# Patient Record
Sex: Male | Born: 1970
Health system: Southern US, Community
[De-identification: ages and names within clinical notes are randomized; demographics above are authoritative.]

## PROBLEM LIST (undated history)

## (undated) DIAGNOSIS — Z87438 Personal history of other diseases of male genital organs: Secondary | ICD-10-CM

## (undated) DIAGNOSIS — Z800 Family history of malignant neoplasm of digestive organs: Secondary | ICD-10-CM

## (undated) DIAGNOSIS — R3129 Other microscopic hematuria: Secondary | ICD-10-CM

## (undated) DIAGNOSIS — R918 Other nonspecific abnormal finding of lung field: Secondary | ICD-10-CM

## (undated) DIAGNOSIS — J324 Chronic pansinusitis: Secondary | ICD-10-CM

## (undated) DIAGNOSIS — L649 Androgenic alopecia, unspecified: Secondary | ICD-10-CM

## (undated) DIAGNOSIS — K602 Anal fissure, unspecified: Secondary | ICD-10-CM

## (undated) DIAGNOSIS — E781 Pure hyperglyceridemia: Secondary | ICD-10-CM

## (undated) DIAGNOSIS — K219 Gastro-esophageal reflux disease without esophagitis: Secondary | ICD-10-CM

## (undated) DIAGNOSIS — J309 Allergic rhinitis, unspecified: Secondary | ICD-10-CM

## (undated) DIAGNOSIS — Z8051 Family history of malignant neoplasm of kidney: Secondary | ICD-10-CM

## (undated) HISTORY — DX: Personal history of other diseases of male genital organs: Z87.438

## (undated) HISTORY — PX: CARDIOVASCULAR STRESS TEST: SHX262

## (undated) HISTORY — DX: Anal fissure, unspecified: K60.2

## (undated) HISTORY — DX: Androgenic alopecia, unspecified: L64.9

## (undated) HISTORY — DX: Chronic pansinusitis: J32.4

## (undated) HISTORY — DX: Family history of malignant neoplasm of digestive organs: Z80.0

## (undated) HISTORY — DX: Pure hyperglyceridemia: E78.1

## (undated) HISTORY — PX: OTHER SURGICAL HISTORY: SHX169

## (undated) HISTORY — DX: Allergic rhinitis, unspecified: J30.9

## (undated) HISTORY — DX: Gastro-esophageal reflux disease without esophagitis: K21.9

## (undated) HISTORY — DX: Other microscopic hematuria: R31.29

## (undated) HISTORY — DX: Other nonspecific abnormal finding of lung field: R91.8

## (undated) HISTORY — DX: Family history of malignant neoplasm of kidney: Z80.51

---

## 2007-11-04 DIAGNOSIS — K602 Anal fissure, unspecified: Secondary | ICD-10-CM

## 2007-11-04 HISTORY — DX: Anal fissure, unspecified: K60.2

## 2012-08-13 DIAGNOSIS — L659 Nonscarring hair loss, unspecified: Secondary | ICD-10-CM | POA: Insufficient documentation

## 2013-12-19 ENCOUNTER — Ambulatory Visit: Payer: Self-pay | Admitting: Family Medicine

## 2014-02-08 ENCOUNTER — Ambulatory Visit (INDEPENDENT_AMBULATORY_CARE_PROVIDER_SITE_OTHER): Payer: 59 | Admitting: Family Medicine

## 2014-02-08 ENCOUNTER — Encounter: Payer: Self-pay | Admitting: Family Medicine

## 2014-02-08 VITALS — BP 120/72 | HR 75 | Temp 97.9°F | Ht 69.5 in | Wt 192.2 lb

## 2014-02-08 DIAGNOSIS — Z0000 Encounter for general adult medical examination without abnormal findings: Secondary | ICD-10-CM | POA: Insufficient documentation

## 2014-02-08 LAB — LIPID PANEL
CHOL/HDL RATIO: 4
Cholesterol: 150 mg/dL (ref 0–200)
HDL: 35.3 mg/dL — ABNORMAL LOW (ref >39.00)
LDL CALC: 89 mg/dL (ref 0–99)
Triglycerides: 131 mg/dL (ref 0.0–149.0)
VLDL: 26.2 mg/dL (ref 0.0–40.0)

## 2014-02-08 LAB — BASIC METABOLIC PANEL
BUN: 14 mg/dL (ref 6–23)
CHLORIDE: 106 meq/L (ref 96–112)
CO2: 25 mEq/L (ref 19–32)
Calcium: 9.6 mg/dL (ref 8.4–10.5)
Creatinine, Ser: 1.1 mg/dL (ref 0.4–1.5)
GFR: 78.87 mL/min (ref >60.00)
Glucose, Bld: 91 mg/dL (ref 70–99)
POTASSIUM: 4.1 meq/L (ref 3.5–5.1)
SODIUM: 139 meq/L (ref 135–145)

## 2014-02-08 NOTE — Progress Notes (Signed)
BP 120/72  Pulse 75  Temp(Src) 97.9 F (36.6 C) (Oral)  Ht 5' 9.5" (1.765 m)  Wt 192 lb 4 oz (87.204 kg)  BMI 27.99 kg/m2  SpO2 98%   CC: new pt to establish, would like CPE   Subjective:    Patient ID: Raymond Nichols, male    DOB: 02/04/1971, 43 y.o.   MRN: [id number]  HPI: Raymond Nichols is a 43 y.o. male presenting on 02/08/2014 for Establish Care   Prior PCP was MD at [facility], seen once for CPE WNL blood work. On propecia for androgenic alopecia.  Preventative: Fasting today. Nocturia x1-2 nightly, newer.  Strong stream. Flu shot not done Tetanus - ~2008 Seat belt use discussed Sunscreen use dicussed.  To see dermatologist in May.  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  Allergies and medications reviewed and updated. No current outpatient prescriptions on file prior to visit.   No current facility-administered medications on file prior to visit.    Review of Systems  Constitutional: Negative for fever, chills, activity change, appetite change, fatigue and unexpected weight change.  HENT: Negative for hearing loss.   Eyes: Negative for visual disturbance.  Respiratory: Negative for cough, chest tightness, shortness of breath and wheezing.   Cardiovascular: Negative for chest pain, palpitations and leg swelling.  Gastrointestinal: Negative for nausea, vomiting, abdominal pain, diarrhea, constipation, blood in stool and abdominal distention.  Genitourinary: Negative for hematuria and difficulty urinating.  Musculoskeletal: Negative for arthralgias, myalgias and neck pain.  Skin: Negative for rash.  Neurological: Negative for dizziness, seizures, syncope and headaches.  Hematological: Negative for adenopathy. Does not bruise/bleed  easily.  Psychiatric/Behavioral: Negative for dysphoric mood. The patient is not nervous/anxious.    Per HPI unless specifically indicated above    Objective:    BP 120/72  Pulse 75  Temp(Src) 97.9 F (36.6 C) (Oral)  Ht 5' 9.5" (1.765 m)  Wt 192 lb 4 oz (87.204 kg)  BMI 27.99 kg/m2  SpO2 98%  Physical Exam  Nursing note and vitals reviewed. Constitutional: He is oriented to person, place, and time. He appears well-developed and well-nourished. No distress.  HENT:  Head: Normocephalic and atraumatic.  Right Ear: Hearing, tympanic membrane, external ear and ear canal normal.  Left Ear: Hearing, tympanic membrane, external ear and ear canal normal.  Nose: Nose normal.  Mouth/Throat: Uvula is midline, oropharynx is clear and moist and mucous membranes are normal. No oropharyngeal exudate, posterior oropharyngeal edema or posterior oropharyngeal erythema.  Eyes: Conjunctivae and EOM are normal. Pupils are equal, round, and reactive to light. No scleral icterus.  Neck: Normal range of motion. Neck supple. No thyromegaly present.  Cardiovascular: Normal rate, regular rhythm, normal heart sounds and intact distal pulses.   No murmur heard. Pulses:      Radial pulses are 2+ on the right side, and 2+ on the left side.  Pulmonary/Chest: Effort normal and breath sounds normal. No respiratory distress. He has no wheezes. He has no rales.  Abdominal: Soft. Bowel sounds are normal. He exhibits no distension and no mass. There is no tenderness. There is no rebound and no guarding.  Musculoskeletal: Normal range of motion. He exhibits no edema.  Lymphadenopathy:    He has no cervical adenopathy.  Neurological: He is alert and oriented  to person, place, and time.  CN grossly intact, station and gait intact  Skin: Skin is warm and dry. No rash noted.  Psychiatric: He has a normal mood and affect. His behavior is normal. Judgment and thought content normal.   No results found for this or any  previous visit.    Assessment & Plan:  Reviewed possible association with propecia and more aggressive prostate cancers.  Problem List Items Addressed This Visit   Health maintenance examination - Primary     Preventative protocols reviewed and updated unless pt declined. Discussed healthy diet and lifestyle. Check fasting labwork today.    Relevant Orders      Lipid panel      Basic metabolic panel       Follow up plan: Return in about 1 year (around 02/09/2015), or as needed, for physical.

## 2014-02-08 NOTE — Patient Instructions (Signed)
Good to meet you today, call us with questions.  Return as needed or in 1 -2 years for next physical.

## 2014-02-08 NOTE — Assessment & Plan Note (Signed)
Preventative protocols reviewed and updated unless pt declined. Discussed healthy diet and lifestyle. Check fasting labwork today.

## 2014-02-08 NOTE — Progress Notes (Signed)
Pre visit review using our clinic review tool, if applicable. No additional management support is needed unless otherwise documented below in the visit note. 

## 2014-02-10 ENCOUNTER — Encounter: Payer: Self-pay | Admitting: *Deleted

## 2015-02-21 ENCOUNTER — Ambulatory Visit (INDEPENDENT_AMBULATORY_CARE_PROVIDER_SITE_OTHER): Payer: 59 | Admitting: Family Medicine

## 2015-02-21 ENCOUNTER — Encounter: Payer: Self-pay | Admitting: Family Medicine

## 2015-02-21 VITALS — BP 114/62 | HR 98 | Temp 98.5°F | Wt 195.2 lb

## 2015-02-21 DIAGNOSIS — J069 Acute upper respiratory infection, unspecified: Secondary | ICD-10-CM

## 2015-02-21 MED ORDER — AMOXICILLIN-POT CLAVULANATE 875-125 MG PO TABS: 1.0000 | ORAL_TABLET | Freq: Two times a day (BID) | ORAL | Status: AC

## 2015-02-21 MED ORDER — FLUTICASONE PROPIONATE 50 MCG/ACT NA SUSP: 2.0000 | Freq: Every day | NASAL | Status: DC

## 2015-02-21 NOTE — Progress Notes (Signed)
Pre visit review using our clinic review tool, if applicable. No additional management support is needed unless otherwise documented below in the visit note. 

## 2015-02-21 NOTE — Progress Notes (Signed)
SUBJECTIVE:  Raymond Nichols is a 44 y.o. male pt of Dr. Darnell Level, new to me, who complains of congestion, sore throat and bilateral sinus pain for 1 day. He denies a history of anorexia, chest pain, dizziness, fatigue, fevers, shortness of breath, sweats and vomiting and denies a history of asthma. Patient denies smoke cigarettes.  Flying out to [place] later today--"I don't want to miserable on my trip."  Taking OTC antihistamine/cold medicine- cannot remember which one.  Not much improvement with rx.  No h/o seasonal allergies.   Current Outpatient Prescriptions on File Prior to Visit  Medication Sig Dispense Refill  . finasteride (PROPECIA) 1 MG tablet Take 1 mg by mouth daily.     No current facility-administered medications on file prior to visit.    No Known Allergies  Past Medical History  Diagnosis Date  . Androgenic alopecia     No past surgical history on file.  Family History  Problem Relation Age of Onset  . Cancer Mother 37    lung (smoker)  . CAD Mother     stent  . Hypertension Mother   . Diabetes Mother   . Cancer Father 70    colon  . Hyperlipidemia Father   . Hypertension Brother   . Diabetes Maternal Grandmother   . Alcohol abuse Maternal Grandfather   . Cancer Paternal Grandfather     pancreatic    History   Social History  . Marital Status: Married    Spouse Name: N/A  . Number of Children: N/A  . Years of Education: N/A   Occupational History  . Not on file.   Social History Main Topics  . Smoking status: Never Smoker   . Smokeless tobacco: Never Used  . Alcohol Use: Yes     Comment: 2 beers once a week  . Drug Use: No  . Sexual Activity: Not on file   Other Topics Concern  . Not on file   Social History Narrative   Caffeine: 1-2 cups coffee   Lives with wife and dad, no pets   Occupation: Freight forwarder for [employer]   Edu: college   Activity: gym 2-3 times a week, weights and cardio   Diet: good water, fruits/vegetables daily    The PMH, PSH, Social History, Family History, Medications, and allergies have been reviewed in [facility], and have been updated if relevant.  OBJECTIVE: BP 114/62 mmHg  Pulse 98  Temp(Src) 98.5 F (36.9 C) (Oral)  Wt 195 lb 4 oz (88.565 kg)  SpO2 98%  He appears well, vital signs are as noted. Ears normal.  Throat and pharynx normal.  Neck supple. No adenopathy in the neck. Nose is congested. Sinuses non tender. The chest is clear, without wheezes or rales.  ASSESSMENT:  viral upper respiratory illness  PLAN: Symptomatic therapy suggested: push fluids, rest and return office visit prn if symptoms persist or worsen. Lack of antibiotic effectiveness discussed with him. eRx sent for flonase, Augmentin rx to fill only if symptoms do not improve or progress over next 5-7 days. Call or return to clinic prn if these symptoms worsen or fail to improve as anticipated. The patient indicates understanding of these issues and agrees with the plan.

## 2015-02-21 NOTE — Patient Instructions (Signed)
Nice to meet you. Have a wonderful trip to [place]!  Start flonase, continue antihistamine. Fill your antibiotic prescription, Augmentin if your symptoms do not improve over next 5-7 days as expected.

## 2015-10-01 ENCOUNTER — Telehealth: Payer: Self-pay | Admitting: *Deleted

## 2015-10-01 ENCOUNTER — Emergency Department (HOSPITAL_COMMUNITY): Payer: 59

## 2015-10-01 ENCOUNTER — Emergency Department (HOSPITAL_COMMUNITY)
Admission: EM | Admit: 2015-10-01 | Discharge: 2015-10-01 | Disposition: A | Discharge status: Home / Self Care | Payer: 59 | Attending: Emergency Medicine | Admitting: Emergency Medicine

## 2015-10-01 ENCOUNTER — Telehealth: Payer: Self-pay | Admitting: Family Medicine

## 2015-10-01 ENCOUNTER — Encounter (HOSPITAL_COMMUNITY): Payer: Self-pay | Admitting: Family Medicine

## 2015-10-01 DIAGNOSIS — R05 Cough: Secondary | ICD-10-CM | POA: Insufficient documentation

## 2015-10-01 DIAGNOSIS — Z872 Personal history of diseases of the skin and subcutaneous tissue: Secondary | ICD-10-CM | POA: Diagnosis not present

## 2015-10-01 DIAGNOSIS — R059 Cough, unspecified: Secondary | ICD-10-CM

## 2015-10-01 DIAGNOSIS — Z79899 Other long term (current) drug therapy: Secondary | ICD-10-CM | POA: Diagnosis not present

## 2015-10-01 DIAGNOSIS — R0789 Other chest pain: Secondary | ICD-10-CM | POA: Diagnosis not present

## 2015-10-01 DIAGNOSIS — R079 Chest pain, unspecified: Secondary | ICD-10-CM | POA: Diagnosis present

## 2015-10-01 DIAGNOSIS — Z7951 Long term (current) use of inhaled steroids: Secondary | ICD-10-CM | POA: Diagnosis not present

## 2015-10-01 LAB — CBC
HCT: 46.1 % (ref 39.0–52.0)
HEMOGLOBIN: 15.9 g/dL (ref 13.0–17.0)
MCH: 30.8 pg (ref 26.0–34.0)
MCHC: 34.5 g/dL (ref 30.0–36.0)
MCV: 89.2 fL (ref 78.0–100.0)
Platelets: 221 10*3/uL (ref 150–400)
RBC: 5.17 MIL/uL (ref 4.22–5.81)
RDW: 12.9 % (ref 11.5–15.5)
WBC: 5.7 10*3/uL (ref 4.0–10.5)

## 2015-10-01 LAB — BASIC METABOLIC PANEL
ANION GAP: 6 (ref 5–15)
BUN: 15 mg/dL (ref 6–20)
CALCIUM: 9.6 mg/dL (ref 8.9–10.3)
CO2: 29 mmol/L (ref 22–32)
Chloride: 105 mmol/L (ref 101–111)
Creatinine, Ser: 1.21 mg/dL (ref 0.61–1.24)
GFR calc Af Amer: >60 mL/min (ref >60)
GLUCOSE: 116 mg/dL — AB (ref 65–99)
Potassium: 4.5 mmol/L (ref 3.5–5.1)
Sodium: 140 mmol/L (ref 135–145)

## 2015-10-01 LAB — I-STAT TROPONIN, ED: TROPONIN I, POC: 0.01 ng/mL (ref 0.00–0.08)

## 2015-10-01 MED ORDER — GI COCKTAIL ~~LOC~~
30.0000 mL | Freq: Once | ORAL | Status: AC
  Administered 2015-10-01: 30 mL via ORAL
  Filled 2015-10-01: qty 30

## 2015-10-01 MED ORDER — ASPIRIN 325 MG PO TABS
325.0000 mg | ORAL_TABLET | Freq: Once | ORAL | Status: AC
  Administered 2015-10-01: 325 mg via ORAL
  Filled 2015-10-01: qty 1

## 2015-10-01 MED ORDER — MORPHINE SULFATE (PF) 2 MG/ML IV SOLN
2.0000 mg | Freq: Once | INTRAVENOUS | Status: DC
  Filled 2015-10-01: qty 1

## 2015-10-01 NOTE — ED Notes (Signed)
Pt here for intermittent chest pain since the 12th of November. Denies any other associated symptoms.

## 2015-10-01 NOTE — Telephone Encounter (Signed)
Per note, he was advised ER.  Looks to be in ER currently.  Routed to PCP as FYI.

## 2015-10-01 NOTE — Telephone Encounter (Signed)
[facility] Patient Name: Raymond Nichols Gender: Male DOB: 06/19/1971 Age: 44 Y 61 M 5 D Return Phone Number: [phone] (Primary) Address: City/State/Zip: [place] Client [facility] - Client Client Site [place] - Day Physician Ria Bush Contact Type Call Call Type Triage / Clinical Relationship To Patient Self Appointment Disposition EMR Appointment Not Necessary Info pasted into Epic Yes Return Phone Number [phone] (Primary) Chief Complaint CHEST PAIN (>=21 years) - pain, pressure, heaviness or tightness Initial Comment Caller states he is having chest pain; happening over the last couple of days PreDisposition Go to ED Nurse Assessment Nurse: Ronnald Ramp, RN, Miranda Date/Time (Eastern Time): 10/01/2015 8:13:53 AM Confirm and document reason for call. If symptomatic, describe symptoms. ---Caller states he has been having pain in chest off and on for a couple of weeks. The pain lasts for 30 min but more frequent today. He is having chest congestion for the same amount of time. Has the patient traveled out of the country within the last 30 days? ---Not Applicable Does the patient have any new or worsening symptoms? ---Yes Will a triage be completed? ---Yes Related visit to physician within the last 2 weeks? ---No Does the PT have any chronic conditions? (i.e. diabetes, asthma, etc.) ---No Is this a behavioral health call? ---No Guidelines Guideline Title Affirmed Question Affirmed Notes Nurse Date/Time Eilene Ghazi Time) Chest Pain [1] Intermittent chest pain or "angina" AND [2] increasing in severity or frequency (Exception: pains lasting a few seconds) Ronnald Ramp, RN, Miranda 10/01/2015 8:15:44 AM Disp. Time Eilene Ghazi Time) Disposition Final User 10/01/2015 8:12:31 AM Send to Urgent Queue Alphonsa Overall 10/01/2015 8:17:41 AM Go  to ED Now Yes Ronnald Ramp, RN, Miranda PLEASE NOTE: All timestamps contained within this report are represented as [place] Standard Time. CONFIDENTIALTY NOTICE: This fax transmission is intended only for the addressee. It contains information that is legally privileged, confidential or otherwise protected from use or disclosure. If you are not the intended recipient, you are strictly prohibited from reviewing, disclosing, copying using or disseminating any of this information or taking any action in reliance on or regarding this information. If you have received this fax in error, please notify us immediately by telephone so that we can arrange for its return to [place]. Phone: [phone], Toll-Free: [phone], Fax: [phone] Page: 2 of 2 Call Id: [id number] Caller Understands: Yes Disagree/Comply: Comply Care Advice Given Per Guideline GO TO ED NOW: You need to be seen in the Emergency Department. Go to the ER at ___________ [place] now. Drive carefully. DRIVING: Another adult should drive. BRING MEDICINES: * Please bring a list of your current medicines when you go to the Emergency Department (ER). * It is also a good idea to bring the pill bottles too. This will help the doctor to make certain you are taking the right medicines and the right dose. NOTHING BY MOUTH: Do not eat or drink anything for now. CALL EMS IF: * Severe difficulty breathing occurs * Passes out or becomes too weak to stand * You become worse. CARE ADVICE given per Chest Pain (Adult) guideline.

## 2015-10-01 NOTE — Telephone Encounter (Signed)
[facility] Patient Name: Raymond Nichols DOB: [birth year] Initial Comment Caller states he is having chest pain; happening over the last couple of days Nurse Assessment Nurse: Ronnald Ramp, RN, Miranda Date/Time (Eastern Time): 10/01/2015 8:13:53 AM Confirm and document reason for call. If symptomatic, describe symptoms. ---Caller states he has been having pain in chest off and on for a couple of weeks. The pain lasts for 30 min but more frequent today. He is having chest congestion for the same amount of time. Has the patient traveled out of the country within the last 30 days? ---Not Applicable Does the patient have any new or worsening symptoms? ---Yes Will a triage be completed? ---Yes Related visit to physician within the last 2 weeks? ---No Does the PT have any chronic conditions? (i.e. diabetes, asthma, etc.) ---No Is this a behavioral health call? ---No Guidelines Guideline Title Affirmed Question Affirmed Notes Chest Pain [1] Intermittent chest pain or "angina" AND [2] increasing in severity or frequency (Exception: pains lasting a few seconds) Final Disposition User Go to ED Now Ronnald Ramp, RN, [facility] - ED Disagree/Comply: Comply

## 2015-10-01 NOTE — Telephone Encounter (Signed)
Noted  

## 2015-10-01 NOTE — Discharge Instructions (Signed)
Your work up today was reassuring and unremarkable for any emergent causes of chest pain. Try to evaluate whether the pain is associated with specific activities such as eating certain foods or certain positions, which may help define what the cause is. It could be muscle pain in your chest wall from coughing, consider taking an antihistamine daily for allergy symptoms. Consider using tylenol or motrin as needed for pain, or tums/maalox for indigestion. Follow up with your regular doctor in 1 week for recheck and ongoing monitoring. Return to the ER for changes or worsening symptoms.   Nonspecific Chest Pain It is often hard to find the cause of chest pain. There is always a chance that your pain could be related to something serious, such as a heart attack or a blood clot in your lungs. Chest pain can also be caused by conditions that are not life-threatening. If you have chest pain, it is very important to follow up with your doctor.  HOME CARE  If you were prescribed an antibiotic medicine, finish it all even if you start to feel better.  Avoid any activities that cause chest pain.  Do not use any tobacco products, including cigarettes, chewing tobacco, or electronic cigarettes. If you need help quitting, ask your doctor.  Do not drink alcohol.  Take medicines only as told by your doctor.  Keep all follow-up visits as told by your doctor. This is important. This includes any further testing if your chest pain does not go away.  Your doctor may tell you to keep your head raised (elevated) while you sleep.  Make lifestyle changes as told by your doctor. These may include:  Getting regular exercise. Ask your doctor to suggest some activities that are safe for you.  Eating a heart-healthy diet. Your doctor or a diet specialist (dietitian) can help you to learn healthy eating options.  Maintaining a healthy weight.  Managing diabetes, if necessary.  Reducing stress. GET HELP  IF:  Your chest pain does not go away, even after treatment.  You have a rash with blisters on your chest.  You have a fever. GET HELP RIGHT AWAY IF:  Your chest pain is worse.  You have an increasing cough, or you cough up blood.  You have severe belly (abdominal) pain.  You feel extremely weak.  You pass out (faint).  You have chills.  You have sudden, unexplained chest discomfort.  You have sudden, unexplained discomfort in your arms, back, neck, or jaw.  You have shortness of breath at any time.  You suddenly start to sweat, or your skin gets clammy.  You feel nauseous.  You vomit.  You suddenly feel light-headed or dizzy.  Your heart begins to beat quickly, or it feels like it is skipping beats. These symptoms may be an emergency. Do not wait to see if the symptoms will go away. Get medical help right away. Call your local emergency services (911 in the [place] Do not drive yourself to the hospital.   This information is not intended to replace advice given to you by your health care provider. Make sure you discuss any questions you have with your health care provider.   Document Released: 04/07/2008 Document Revised: 11/10/2014 Document Reviewed: 05/26/2014 Elsevier Interactive Patient Education 2016 [employer]  Cough, Adult A cough helps to clear your throat and lungs. A cough may last only 2-3 weeks (acute), or it may last longer than 8 weeks (chronic). Many different things can cause a cough. A cough  may be a sign of an illness or another medical condition. HOME CARE  Pay attention to any changes in your cough.  Take medicines only as told by your doctor.  If you were prescribed an antibiotic medicine, take it as told by your doctor. Do not stop taking it even if you start to feel better.  Talk with your doctor before you try using a cough medicine.  Drink enough fluid to keep your pee (urine) clear or pale yellow.  If the air is dry, use a cold  steam vaporizer or humidifier in your home.  Stay away from things that make you cough at work or at home.  If your cough is worse at night, try using extra pillows to raise your head up higher while you sleep.  Do not smoke, and try not to be around smoke. If you need help quitting, ask your doctor.  Do not have caffeine.  Do not drink alcohol.  Rest as needed. GET HELP IF:  You have new problems (symptoms).  You cough up yellow fluid (pus).  Your cough does not get better after 2-3 weeks, or your cough gets worse.  Medicine does not help your cough and you are not sleeping well.  You have pain that gets worse or pain that is not helped with medicine.  You have a fever.  You are losing weight and you do not know why.  You have night sweats. GET HELP RIGHT AWAY IF:  You cough up blood.  You have trouble breathing.  Your heartbeat is very fast.   This information is not intended to replace advice given to you by your health care provider. Make sure you discuss any questions you have with your health care provider.   Document Released: 07/03/2011 Document Revised: 07/11/2015 Document Reviewed: 12/27/2014 Elsevier Interactive Patient Education [employer].

## 2015-10-01 NOTE — ED Provider Notes (Signed)
CSN: [id number]     Arrival date & time 10/01/15  0913 History   First MD Initiated Contact with Patient 10/01/15 0914     Chief Complaint  Patient presents with  . Chest Pain     (Consider location/radiation/quality/duration/timing/severity/associated sxs/prior Treatment) HPI Comments: Raymond Nichols is a 44 y.o. male with a PMHx of alopecia, who presents to the ED with complaints of intermittent chest pain 2 weeks. He describes the pain is 3/10 aching in the left chest, nonradiating, with no known aggravating or alleviating factors, and unchanged with exertion or inspiration. He states he is very active doing weight training and cardio at the gym 3-4 times per week, and states that he does not experience the chest pain while he is doing these activities. Associated symptoms include one month of intermittent mostly dry cough which he attributes to season changes. He denies any fevers, chills, other URI symptoms, wheezing, shortness breath, leg swelling, recent travel/surgery/immobilization, history of DVT/PE, abdominal pain, nausea, vomiting, diarrhea, constipation, dysuria, hematuria, numbness, tingling, weakness, claudication, orthopnea, lightheadedness, or diaphoresis. He is a nonsmoker. Positive family history of CAD in his mother and MI in his maternal grandmother.  Patient is a 45 y.o. male presenting with chest pain. The history is provided by the patient. No language interpreter was used.  Chest Pain Pain location:  L chest Pain quality: aching   Pain radiates to:  Does not radiate Pain radiates to the back: no   Pain severity:  Mild Onset quality:  Gradual Duration:  2 weeks Timing:  Intermittent Progression:  Unchanged Chronicity:  New Context: at rest   Relieved by:  None tried Worsened by:  Nothing tried Ineffective treatments:  None tried Associated symptoms: cough   Associated symptoms: no abdominal pain, no claudication, no diaphoresis, no fever, no lower extremity  edema, no nausea, no numbness, no orthopnea, no shortness of breath, not vomiting and no weakness   Risk factors: male sex   Risk factors: no coronary artery disease, no diabetes mellitus, no high cholesterol, no hypertension, no immobilization, no prior DVT/PE, no smoking and no surgery     Past Medical History  Diagnosis Date  . Androgenic alopecia    No past surgical history on file. Family History  Problem Relation Age of Onset  . Cancer Mother 30    lung (smoker)  . CAD Mother     stent  . Hypertension Mother   . Diabetes Mother   . Cancer Father 33    colon  . Hyperlipidemia Father   . Hypertension Brother   . Diabetes Maternal Grandmother   . Alcohol abuse Maternal Grandfather   . Cancer Paternal Grandfather     pancreatic   Social History  Substance Use Topics  . Smoking status: Never Smoker   . Smokeless tobacco: Never Used  . Alcohol Use: Yes     Comment: 2 beers once a week    Review of Systems  Constitutional: Negative for fever, chills and diaphoresis.  HENT: Negative for ear discharge, ear pain and rhinorrhea.   Respiratory: Positive for cough. Negative for shortness of breath and wheezing.   Cardiovascular: Positive for chest pain. Negative for orthopnea, claudication and leg swelling.  Gastrointestinal: Negative for nausea, vomiting, abdominal pain, diarrhea and constipation.  Genitourinary: Negative for dysuria and hematuria.  Musculoskeletal: Negative for myalgias and arthralgias.  Skin: Negative for color change.  Allergic/Immunologic: Negative for immunocompromised state.  Neurological: Negative for weakness, light-headedness and numbness.  Psychiatric/Behavioral: Negative for confusion.  10 Systems reviewed and are negative for acute change except as noted in the HPI.    Allergies  Review of patient's allergies indicates no known allergies.  Home Medications   Prior to Admission medications   Medication Sig Start Date End Date Taking?  Authorizing Provider  finasteride (PROPECIA) 1 MG tablet Take 1 mg by mouth daily.    Historical Provider, MD  fluticasone (FLONASE) 50 MCG/ACT nasal spray Place 2 sprays into both nostrils daily. 02/21/15   Lucille Passy, MD   BP 133/82 mmHg  Pulse 65  Temp(Src) 98.3 F (36.8 C)  Resp 18  SpO2 98% Physical Exam  Constitutional: He is oriented to person, place, and time. Vital signs are normal. He appears well-developed and well-nourished.  Non-toxic appearance. No distress.  Afebrile, nontoxic, NAD  HENT:  Head: Normocephalic and atraumatic.  Mouth/Throat: Oropharynx is clear and moist and mucous membranes are normal.  Eyes: Conjunctivae and EOM are normal. Right eye exhibits no discharge. Left eye exhibits no discharge.  Neck: Normal range of motion. Neck supple.  Cardiovascular: Normal rate, regular rhythm, normal heart sounds and intact distal pulses.  Exam reveals no gallop and no friction rub.   No murmur heard. RRR, nl s1/s2, no m/r/g, distal pulses intact, no pedal edema   Pulmonary/Chest: Effort normal and breath sounds normal. No respiratory distress. He has no decreased breath sounds. He has no wheezes. He has no rhonchi. He has no rales. He exhibits no tenderness, no crepitus, no deformity and no retraction.  CTAB in all lung fields, no w/r/r, no hypoxia or increased WOB, speaking in full sentences, SpO2 98% on RA  No chest wall tenderness, crepitus, retractions, or deformity  Abdominal: Soft. Normal appearance and bowel sounds are normal. He exhibits no distension. There is no tenderness. There is no rigidity, no rebound, no guarding, no CVA tenderness, no tenderness at McBurney's point and negative Murphy's sign.  Musculoskeletal: Normal range of motion.  MAE x4 Strength and sensation grossly intact Distal pulses intact No pedal edema, neg homan's bilaterally   Neurological: He is alert and oriented to person, place, and time. He has normal strength. No sensory deficit.   Skin: Skin is warm, dry and intact. No rash noted.  Psychiatric: He has a normal mood and affect.  Nursing note and vitals reviewed.   ED Course  Procedures (including critical care time) Labs Review Labs Reviewed  BASIC METABOLIC PANEL - Abnormal; Notable for the following:    Glucose, Bld 116 (*)    All other components within normal limits  CBC  I-STAT TROPOININ, ED    Imaging Review Dg Chest 2 View  10/01/2015  CLINICAL DATA:  Chest pain EXAM: CHEST  2 VIEW COMPARISON:  None. FINDINGS: Heart size and vascularity normal. Negative for infiltrate effusion or mass. Mild apical scarring bilaterally. IMPRESSION: No active cardiopulmonary disease. Electronically Signed   By: Franchot Gallo M.D.   On: 10/01/2015 10:09   I have personally reviewed and evaluated these images and lab results as part of my medical decision-making.   EKG Interpretation   Date/Time:  Monday October 01 2015 09:20:37 EST Ventricular Rate:  68 PR Interval:  151 QRS Duration: 89 QT Interval:  406 QTC Calculation: 432 R Axis:   55 Text Interpretation:  Age not entered, assumed to be  44 years old for  purpose of ECG interpretation Sinus rhythm Confirmed by Jeneen Rinks  MD, [place]  [phone]) on 10/01/2015 10:19:56 AM      MDM  Final diagnoses:  Atypical chest pain  Cough    44 y.o. male complaining of intermittent chest pain 2 weeks, has had a cough intermittently for 1 month which he states was due to season changes. No shortness of breath, no risk factors or signs/symptoms for PE/DVT. Pt very active, does cardio 3-4x/week and this does not cause pain, which is reassuring. Pt has +FHx of MI and CAD in his mother's side, but is nonsmoker with no medical problems of his own, making him very low risk. Will obtain basic labs, EKG, chest x-ray, and give GI cocktail, morphine, and aspirin. Will reassess shortly.  10:22 AM Labs unremarkable, troponin neg, CXR clear, EKG unremarkable. Pt feeling no pain after  ASA and GI cocktail, but thinks the pain had resolved before these were given anyway. Unclear exact etiology for his symptoms, but low risk for ACS, doubt need for admission. Could be musculoskeletal due to the cough, discussed antihistamine use. Will have him f/up with PCP in 1wk, discussed seeing if there is any correlation to food and if so then try tums/maalox as needed for this. I explained the diagnosis and have given explicit precautions to return to the ER including for any other new or worsening symptoms. The patient understands and accepts the medical plan as it's been dictated and I have answered their questions. Discharge instructions concerning home care and prescriptions have been given. The patient is STABLE and is discharged to home in good condition.  BP 118/72 mmHg  Pulse 65  Temp(Src) 98.3 F (36.8 C)  Resp 15  SpO2 100%  Meds ordered this encounter  Medications  . gi cocktail (Maalox,Lidocaine,Donnatal)    Sig:   . morphine 2 MG/ML injection 2 mg    Sig:   . aspirin tablet 325 mg    Sig:      Belisa Eichholz Camprubi-Soms, PA-C 10/01/15 [place], MD 10/10/15 [phone]

## 2015-10-11 ENCOUNTER — Encounter: Payer: 59 | Admitting: Family Medicine

## 2015-10-31 ENCOUNTER — Ambulatory Visit (INDEPENDENT_AMBULATORY_CARE_PROVIDER_SITE_OTHER): Payer: 59 | Admitting: Family Medicine

## 2015-10-31 ENCOUNTER — Encounter: Payer: Self-pay | Admitting: Family Medicine

## 2015-10-31 VITALS — BP 104/70 | HR 74 | Temp 97.7°F | Ht 69.5 in | Wt 209.2 lb

## 2015-10-31 DIAGNOSIS — J209 Acute bronchitis, unspecified: Secondary | ICD-10-CM | POA: Diagnosis not present

## 2015-10-31 MED ORDER — HYDROCODONE-HOMATROPINE 5-1.5 MG/5ML PO SYRP: 5.0000 mL | ORAL_SOLUTION | Freq: Three times a day (TID) | ORAL | Status: DC | PRN

## 2015-10-31 MED ORDER — AZITHROMYCIN 250 MG PO TABS: ORAL_TABLET | ORAL | Status: DC

## 2015-10-31 MED ORDER — BENZONATATE 200 MG PO CAPS: 200.0000 mg | ORAL_CAPSULE | Freq: Three times a day (TID) | ORAL | Status: DC | PRN

## 2015-10-31 NOTE — Progress Notes (Signed)
Subjective:    Patient ID: Raymond Nichols, male    DOB: [birth year], 44 y.o.   MRN: [id number]  HPI Here for uri symptoms   Started thurs nt  Congestion  Now painful cough with burning in chest - green to brown sputum  Was exp to pneumonia at work ? Maybe a little wheezing  Nasal congestion  Some sinus pressure under eyes  ? Fever - a little achey   Tired  Ears ok  ST at first-better now   Non smoker  No asthma   Patient Active Problem List   Diagnosis Date Noted  . Health maintenance examination 02/08/2014   Past Medical History  Diagnosis Date  . Androgenic alopecia    No past surgical history on file. Social History  Substance Use Topics  . Smoking status: Never Smoker   . Smokeless tobacco: Never Used  . Alcohol Use: Yes     Comment: 2 beers once a week   Family History  Problem Relation Age of Onset  . Cancer Mother 74    lung (smoker)  . CAD Mother     stent  . Hypertension Mother   . Diabetes Mother   . Cancer Father 108    colon  . Hyperlipidemia Father   . Hypertension Brother   . Diabetes Maternal Grandmother   . Alcohol abuse Maternal Grandfather   . Cancer Paternal Grandfather     pancreatic   No Known Allergies Current Outpatient Prescriptions on File Prior to Visit  Medication Sig Dispense Refill  . finasteride (PROPECIA) 1 MG tablet Take 1 mg by mouth daily.     No current facility-administered medications on file prior to visit.    Review of Systems Review of Systems  Constitutional: Negative for fever, appetite change,  and unexpected weight change.  ENT pos for cong/rhinorrhea and sinus pressure  Eyes: Negative for pain and visual disturbance.  Respiratory: Negative for  shortness of breath.   Cardiovascular: Negative for cp or palpitations    Gastrointestinal: Negative for nausea, diarrhea and constipation.  Genitourinary: Negative for urgency and frequency.  Skin: Negative for pallor or rash   Neurological: Negative for  weakness, light-headedness, numbness and headaches.  Hematological: Negative for adenopathy. Does not bruise/bleed easily.  Psychiatric/Behavioral: Negative for dysphoric mood. The patient is not nervous/anxious.         Objective:   Physical Exam  Constitutional: He appears well-developed and well-nourished. No distress.  obese and well appearing   HENT:  Head: Normocephalic and atraumatic.  Right Ear: External ear normal.  Left Ear: External ear normal.  Mouth/Throat: Oropharynx is clear and moist.  Nares are injected and congested  No sinus tenderness Clear rhinorrhea and post nasal drip   Eyes: Conjunctivae and EOM are normal. Pupils are equal, round, and reactive to light. Right eye exhibits no discharge. Left eye exhibits no discharge.  Neck: Normal range of motion. Neck supple.  Cardiovascular: Normal rate and normal heart sounds.   Pulmonary/Chest: Effort normal and breath sounds normal. No respiratory distress. He has no wheezes. He has no rales. He exhibits no tenderness.  Harsh bs Scattered rhonchi No rales   Lymphadenopathy:    He has no cervical adenopathy.  Neurological: He is alert.  Skin: Skin is warm and dry. No rash noted.  Psychiatric: He has a normal mood and affect.          Assessment & Plan:   Problem List Items Addressed This Visit  Respiratory   Acute bronchitis - Primary    Drink lots of fluids Try to get rest  Ibuprofen for sore chest or fever or headache otc is fine  Take the zpak for bronchitis/ (covers for pneumonia since you were exposed)  Tessalon and hycodan for cough    Update if not starting to improve in a week or if worsening

## 2015-10-31 NOTE — Progress Notes (Signed)
Pre visit review using our clinic review tool, if applicable. No additional management support is needed unless otherwise documented below in the visit note. 

## 2015-10-31 NOTE — Patient Instructions (Signed)
Drink lots of fluids Try to get rest  Ibuprofen for sore chest or fever or headache otc is fine  Take the zpak for bronchitis/ (covers for pneumonia since you were exposed)  Tessalon and hycodan for cough    Update if not starting to improve in a week or if worsening

## 2015-11-01 NOTE — Assessment & Plan Note (Signed)
Drink lots of fluids Try to get rest  Ibuprofen for sore chest or fever or headache otc is fine  Take the zpak for bronchitis/ (covers for pneumonia since you were exposed)  Tessalon and hycodan for cough    Update if not starting to improve in a week or if worsening

## 2016-01-04 ENCOUNTER — Ambulatory Visit (INDEPENDENT_AMBULATORY_CARE_PROVIDER_SITE_OTHER): Payer: 59 | Admitting: Family Medicine

## 2016-01-04 ENCOUNTER — Encounter: Payer: Self-pay | Admitting: Family Medicine

## 2016-01-04 VITALS — BP 116/78 | HR 76 | Temp 98.1°F | Wt 206.0 lb

## 2016-01-04 DIAGNOSIS — Z0000 Encounter for general adult medical examination without abnormal findings: Secondary | ICD-10-CM

## 2016-01-04 DIAGNOSIS — E786 Lipoprotein deficiency: Secondary | ICD-10-CM | POA: Diagnosis not present

## 2016-01-04 DIAGNOSIS — K219 Gastro-esophageal reflux disease without esophagitis: Secondary | ICD-10-CM

## 2016-01-04 LAB — BASIC METABOLIC PANEL
BUN: 18 mg/dL (ref 6–23)
CHLORIDE: 106 meq/L (ref 96–112)
CO2: 27 mEq/L (ref 19–32)
CREATININE: 1.11 mg/dL (ref 0.40–1.50)
Calcium: 9.5 mg/dL (ref 8.4–10.5)
GFR: 76.54 mL/min (ref >60.00)
Glucose, Bld: 103 mg/dL — ABNORMAL HIGH (ref 70–99)
Potassium: 4.1 mEq/L (ref 3.5–5.1)
Sodium: 140 mEq/L (ref 135–145)

## 2016-01-04 LAB — LIPID PANEL
CHOL/HDL RATIO: 5
Cholesterol: 175 mg/dL (ref 0–200)
HDL: 36.2 mg/dL — AB (ref >39.00)
LDL Cholesterol: 103 mg/dL — ABNORMAL HIGH (ref 0–99)
NonHDL: 139.13
TRIGLYCERIDES: 181 mg/dL — AB (ref 0.0–149.0)
VLDL: 36.2 mg/dL (ref 0.0–40.0)

## 2016-01-04 NOTE — Patient Instructions (Signed)
You are doing well today Return as needed or in 1-2 years for next physical. GERD precautions:  Head of bed elevated. Avoidance of citrus, fatty foods, chocolate, peppermint, and excessive alcohol, along with sodas, orange juice (acidic drinks) At least a few hours between dinner and bed, minimize naps after eating. No smoking.  Health Maintenance, Male A healthy lifestyle and preventative care can promote health and wellness.  Maintain regular health, dental, and eye exams.  Eat a healthy diet. Foods like vegetables, fruits, whole grains, low-fat dairy products, and lean protein foods contain the nutrients you need and are low in calories. Decrease your intake of foods high in solid fats, added sugars, and salt. Get information about a proper diet from your health care provider, if necessary.  Regular physical exercise is one of the most important things you can do for your health. Most adults should get at least 150 minutes of moderate-intensity exercise (any activity that increases your heart rate and causes you to sweat) each week. In addition, most adults need muscle-strengthening exercises on 2 or more days a week.   Maintain a healthy weight. The body mass index (BMI) is a screening tool to identify possible weight problems. It provides an estimate of body fat based on height and weight. Your health care provider can find your BMI and can help you achieve or maintain a healthy weight. For males 20 years and older:  A BMI below 18.5 is considered underweight.  A BMI of 18.5 to 24.9 is normal.  A BMI of 25 to 29.9 is considered overweight.  A BMI of 30 and above is considered obese.  Maintain normal blood lipids and cholesterol by exercising and minimizing your intake of saturated fat. Eat a balanced diet with plenty of fruits and vegetables. Blood tests for lipids and cholesterol should begin at age 40 and be repeated every 5 years. If your lipid or cholesterol levels are high, you  are over age 22, or you are at high risk for heart disease, you may need your cholesterol levels checked more frequently.Ongoing high lipid and cholesterol levels should be treated with medicines if diet and exercise are not working.  If you smoke, find out from your health care provider how to quit. If you do not use tobacco, do not start.  Lung cancer screening is recommended for adults aged 82-80 years who are at high risk for developing lung cancer because of a history of smoking. A yearly low-dose CT scan of the lungs is recommended for people who have at least a 30-pack-year history of smoking and are current smokers or have quit within the past 15 years. A pack year of smoking is smoking an average of 1 pack of cigarettes a day for 1 year (for example, a 30-pack-year history of smoking could mean smoking 1 pack a day for 30 years or 2 packs a day for 15 years). Yearly screening should continue until the smoker has stopped smoking for at least 15 years. Yearly screening should be stopped for people who develop a health problem that would prevent them from having lung cancer treatment.  If you choose to drink alcohol, do not have more than 2 drinks per day. One drink is considered to be 12 oz (360 mL) of beer, 5 oz (150 mL) of wine, or 1.5 oz (45 mL) of liquor.  Avoid the use of street drugs. Do not share needles with anyone. Ask for help if you need support or instructions about stopping the  use of drugs.  High blood pressure causes heart disease and increases the risk of stroke. High blood pressure is more likely to develop in:  People who have blood pressure in the end of the normal range (100-139/85-89 mm Hg).  People who are overweight or obese.  People who are African American.  If you are 14-20 years of age, have your blood pressure checked every 3-5 years. If you are 61 years of age or older, have your blood pressure checked every year. You should have your blood pressure measured  twice--once when you are at a hospital or clinic, and once when you are not at a hospital or clinic. Record the average of the two measurements. To check your blood pressure when you are not at a hospital or clinic, you can use:  An automated blood pressure machine at a pharmacy.  A home blood pressure monitor.  If you are 58-81 years old, ask your health care provider if you should take aspirin to prevent heart disease.  Diabetes screening involves taking a blood sample to check your fasting blood sugar level. This should be done once every 3 years after age 6 if you are at a normal weight and without risk factors for diabetes. Testing should be considered at a younger age or be carried out more frequently if you are overweight and have at least 1 risk factor for diabetes.  Colorectal cancer can be detected and often prevented. Most routine colorectal cancer screening begins at the age of 80 and continues through age 95. However, your health care provider may recommend screening at an earlier age if you have risk factors for colon cancer. On a yearly basis, your health care provider may provide home test kits to check for hidden blood in the stool. A small camera at the end of a tube may be used to directly examine the colon (sigmoidoscopy or colonoscopy) to detect the earliest forms of colorectal cancer. Talk to your health care provider about this at age 58 when routine screening begins. A direct exam of the colon should be repeated every 5-10 years through age 60, unless early forms of precancerous polyps or small growths are found.  People who are at an increased risk for hepatitis B should be screened for this virus. You are considered at high risk for hepatitis B if:  You were born in a country where hepatitis B occurs often. Talk with your health care provider about which countries are considered high risk.  Your parents were born in a high-risk country and you have not received a shot to  protect against hepatitis B (hepatitis B vaccine).  You have HIV or AIDS.  You use needles to inject street drugs.  You live with, or have sex with, someone who has hepatitis B.  You are a man who has sex with other men (MSM).  You get hemodialysis treatment.  You take certain medicines for conditions like cancer, organ transplantation, and autoimmune conditions.  Hepatitis C blood testing is recommended for all people born from [birth year] through 1965 and any individual with known risk factors for hepatitis C.  Healthy men should no longer receive prostate-specific antigen (PSA) blood tests as part of routine cancer screening. Talk to your health care provider about prostate cancer screening.  Testicular cancer screening is not recommended for adolescents or adult males who have no symptoms. Screening includes self-exam, a health care provider exam, and other screening tests. Consult with your health care provider about any symptoms  you have or any concerns you have about testicular cancer.  Practice safe sex. Use condoms and avoid high-risk sexual practices to reduce the spread of sexually transmitted infections (STIs).  You should be screened for STIs, including gonorrhea and chlamydia if:  You are sexually active and are younger than 24 years.  You are older than 24 years, and your health care provider tells you that you are at risk for this type of infection.  Your sexual activity has changed since you were last screened, and you are at an increased risk for chlamydia or gonorrhea. Ask your health care provider if you are at risk.  If you are at risk of being infected with HIV, it is recommended that you take a prescription medicine daily to prevent HIV infection. This is called pre-exposure prophylaxis (PrEP). You are considered at risk if:  You are a man who has sex with other men (MSM).  You are a heterosexual man who is sexually active with multiple partners.  You take drugs by  injection.  You are sexually active with a partner who has HIV.  Talk with your health care provider about whether you are at high risk of being infected with HIV. If you choose to begin PrEP, you should first be tested for HIV. You should then be tested every 3 months for as long as you are taking PrEP.  Use sunscreen. Apply sunscreen liberally and repeatedly throughout the day. You should seek shade when your shadow is shorter than you. Protect yourself by wearing long sleeves, pants, a wide-brimmed hat, and sunglasses year round whenever you are outdoors.  Tell your health care provider of new moles or changes in moles, especially if there is a change in shape or color. Also, tell your health care provider if a mole is larger than the size of a pencil eraser.  A one-time screening for abdominal aortic aneurysm (AAA) and surgical repair of large AAAs by ultrasound is recommended for men aged 29-75 years who are current or former smokers.  Stay current with your vaccines (immunizations).   This information is not intended to replace advice given to you by your health care provider. Make sure you discuss any questions you have with your health care provider.   Document Released: 04/17/2008 Document Revised: 11/10/2014 Document Reviewed: 03/17/2011 Elsevier Interactive Patient Education [employer].

## 2016-01-04 NOTE — Progress Notes (Signed)
BP 116/78 mmHg  Pulse 76  Temp(Src) 98.1 F (36.7 C) (Oral)  Wt 206 lb (93.441 kg)   CC: CPE  Subjective:    Patient ID: Raymond Nichols, male    DOB: August 08, 1971, 45 y.o.   MRN: [id number]  HPI: Raymond Nichols is a 45 y.o. male presenting on 01/04/2016 for Annual Exam   On propecia for androgenic alopecia.   Atypical chest pain s/p benign eval at ER. Feels this is GERD related because it has improved on OTC PPI course. Increased throat clearing. No cough. Takes pepcid AC as well. Ongoing sxs over last 5 months.   Preventative: Flu shot not done  Tetanus - ~2008  Seat belt use discussed  Sunscreen use dicussed. no changing moles on skin.  Caffeine: 1 cup coffee Lives with wife and father, no pets  Occupation: Freight forwarder for [employer]  Edu: college  Activity: gym 2-3 times a week, weights and cardio, running regularly  Diet: good water, some fruits/vegetables   Relevant past medical, surgical, family and social history reviewed and updated as indicated. Interim medical history since our last visit reviewed. Allergies and medications reviewed and updated. Current Outpatient Prescriptions on File Prior to Visit  Medication Sig  . finasteride (PROPECIA) 1 MG tablet Take 1 mg by mouth daily.   No current facility-administered medications on file prior to visit.    Review of Systems  Constitutional: Negative for fever, chills, activity change, appetite change, fatigue and unexpected weight change.  HENT: Positive for congestion. Negative for hearing loss.   Eyes: Negative for visual disturbance.  Respiratory: Negative for cough, chest tightness, shortness of breath and wheezing.   Cardiovascular: Negative for chest pain, palpitations and leg swelling.  Gastrointestinal: Negative for nausea, vomiting, abdominal pain, diarrhea, constipation, blood in stool and abdominal distention.  Genitourinary: Negative for hematuria and difficulty urinating.  Musculoskeletal: Negative for  myalgias, arthralgias and neck pain.  Skin: Negative for rash.  Neurological: Negative for dizziness, seizures, syncope and headaches.  Hematological: Negative for adenopathy. Does not bruise/bleed easily.  Psychiatric/Behavioral: Negative for dysphoric mood. The patient is not nervous/anxious.    Per HPI unless specifically indicated in ROS section     Objective:    BP 116/78 mmHg  Pulse 76  Temp(Src) 98.1 F (36.7 C) (Oral)  Wt 206 lb (93.441 kg)  Wt Readings from Last 3 Encounters:  01/04/16 206 lb (93.441 kg)  10/31/15 209 lb 4 oz (94.915 kg)  02/21/15 195 lb 4 oz (88.565 kg)   Body mass index is 29.99 kg/(m^2).  Physical Exam  Constitutional: He is oriented to person, place, and time. He appears well-developed and well-nourished. No distress.  HENT:  Head: Normocephalic and atraumatic.  Right Ear: Hearing, tympanic membrane, external ear and ear canal normal.  Left Ear: Hearing, tympanic membrane, external ear and ear canal normal.  Nose: Nose normal.  Mouth/Throat: Uvula is midline, oropharynx is clear and moist and mucous membranes are normal. No oropharyngeal exudate, posterior oropharyngeal edema or posterior oropharyngeal erythema.  Eyes: Conjunctivae and EOM are normal. Pupils are equal, round, and reactive to light. No scleral icterus.  Neck: Normal range of motion. Neck supple. No thyromegaly present.  Cardiovascular: Normal rate, regular rhythm, normal heart sounds and intact distal pulses.   No murmur heard. Pulses:      Radial pulses are 2+ on the right side, and 2+ on the left side.  Pulmonary/Chest: Effort normal and breath sounds normal. No respiratory distress. He has no wheezes. He  has no rales.  Abdominal: Soft. Bowel sounds are normal. He exhibits no distension and no mass. There is no tenderness. There is no rebound and no guarding.  Musculoskeletal: Normal range of motion. He exhibits no edema.  Lymphadenopathy:    He has no cervical adenopathy.    Neurological: He is alert and oriented to person, place, and time.  CN grossly intact, station and gait intact  Skin: Skin is warm and dry. No rash noted.  Psychiatric: He has a normal mood and affect. His behavior is normal. Judgment and thought content normal.  Nursing note and vitals reviewed.  Results for orders placed or performed during the hospital encounter of [id number]  Basic metabolic panel  Result Value Ref Range   Sodium 140 135 - 145 mmol/L   Potassium 4.5 3.5 - 5.1 mmol/L   Chloride 105 101 - 111 mmol/L   CO2 29 22 - 32 mmol/L   Glucose, Bld 116 (H) 65 - 99 mg/dL   BUN 15 6 - 20 mg/dL   Creatinine, Ser 1.21 0.61 - 1.24 mg/dL   Calcium 9.6 8.9 - 10.3 mg/dL   GFR calc non Af Amer >60 >60 mL/min   GFR calc Af Amer >60 >60 mL/min   Anion gap 6 5 - 15  CBC  Result Value Ref Range   WBC 5.7 4.0 - 10.5 K/uL   RBC 5.17 4.22 - 5.81 MIL/uL   Hemoglobin 15.9 13.0 - 17.0 g/dL   HCT 46.1 39.0 - 52.0 %   MCV 89.2 78.0 - 100.0 fL   MCH 30.8 26.0 - 34.0 pg   MCHC 34.5 30.0 - 36.0 g/dL   RDW 12.9 11.5 - 15.5 %   Platelets 221 150 - 400 K/uL  I-stat troponin, ED (not at [facility])  Result Value Ref Range   Troponin i, poc 0.01 0.00 - 0.08 ng/mL   Comment 3              Assessment & Plan:   Problem List Items Addressed This Visit    Low HDL (under 40)    Check FLP today. Good aerobic exercise.      Relevant Orders   Lipid panel   Basic metabolic panel   Health maintenance examination - Primary    Preventative protocols reviewed and updated unless pt declined. Discussed healthy diet and lifestyle.       GERD (gastroesophageal reflux disease)    Discussed diet and lifestyle changes to improve symptoms. Discussed OTC PPI use Discussed weight loss benefit Update if persistent symptoms.          Follow up plan: Return in about 1 year (around 01/03/2017), or as needed, for annual exam, prior fasting for blood work.

## 2016-01-04 NOTE — Assessment & Plan Note (Signed)
Discussed diet and lifestyle changes to improve symptoms. Discussed OTC PPI use Discussed weight loss benefit Update if persistent symptoms.

## 2016-01-04 NOTE — Assessment & Plan Note (Signed)
Check FLP today. Good aerobic exercise.

## 2016-01-04 NOTE — Progress Notes (Signed)
Pre visit review using our clinic review tool, if applicable. No additional management support is needed unless otherwise documented below in the visit note. 

## 2016-01-04 NOTE — Assessment & Plan Note (Signed)
Preventative protocols reviewed and updated unless pt declined. Discussed healthy diet and lifestyle.  

## 2016-06-30 ENCOUNTER — Encounter: Payer: Self-pay | Admitting: Family Medicine

## 2016-06-30 ENCOUNTER — Ambulatory Visit (INDEPENDENT_AMBULATORY_CARE_PROVIDER_SITE_OTHER): Payer: 59 | Admitting: Family Medicine

## 2016-06-30 VITALS — BP 110/76 | HR 72 | Temp 98.2°F | Wt 204.0 lb

## 2016-06-30 DIAGNOSIS — N529 Male erectile dysfunction, unspecified: Secondary | ICD-10-CM

## 2016-06-30 DIAGNOSIS — R6882 Decreased libido: Secondary | ICD-10-CM

## 2016-06-30 DIAGNOSIS — R350 Frequency of micturition: Secondary | ICD-10-CM | POA: Diagnosis not present

## 2016-06-30 LAB — POC URINALSYSI DIPSTICK (AUTOMATED)
Bilirubin, UA: NEGATIVE
GLUCOSE UA: NEGATIVE
Ketones, UA: NEGATIVE
Leukocytes, UA: NEGATIVE
Nitrite, UA: NEGATIVE
Protein, UA: NEGATIVE
RBC UA: NEGATIVE
SPEC GRAV UA: 1.025
UROBILINOGEN UA: 0.2
pH, UA: 6

## 2016-06-30 MED ORDER — SILDENAFIL CITRATE 100 MG PO TABS: 50.0000 mg | ORAL_TABLET | Freq: Every day | ORAL | 5 refills | Status: DC | PRN

## 2016-06-30 NOTE — Progress Notes (Signed)
Pre visit review using our clinic review tool, if applicable. No additional management support is needed unless otherwise documented below in the visit note. 

## 2016-06-30 NOTE — Progress Notes (Signed)
BP 110/76   Pulse 72   Temp 98.2 F (36.8 C) (Oral)   Wt 204 lb (92.5 kg)   BMI 29.69 kg/m    CC: discuss urinary issues  Subjective:    Patient ID: Raymond Nichols, male    DOB: 11-14-1970, 45 y.o.   MRN: [id number]  HPI: Raymond Nichols is a 45 y.o. male presenting on 06/30/2016 for Erectile Dysfunction   2 months ago noticed decreased sex drive as well as some trouble maintaining erection. Also notices some nocturia x1-2, with some persistent urgency afterwards. Energy level fine. Mood fine.   Increased stress at work, but that has improved.   Some daytime urgency, frequency. No dysuria, hematuria, abd pain, flank pain, fevers/chills, nausea/vomiting, back pain. No rectal pain.   Longstanding on propecia over last 17 yrs.   Relevant past medical, surgical, family and social history reviewed and updated as indicated. Interim medical history since our last visit reviewed. Allergies and medications reviewed and updated. Current Outpatient Prescriptions on File Prior to Visit  Medication Sig  . finasteride (PROPECIA) 1 MG tablet Take 1 mg by mouth daily.   No current facility-administered medications on file prior to visit.     Review of Systems Per HPI unless specifically indicated in ROS section     Objective:    BP 110/76   Pulse 72   Temp 98.2 F (36.8 C) (Oral)   Wt 204 lb (92.5 kg)   BMI 29.69 kg/m   Wt Readings from Last 3 Encounters:  06/30/16 204 lb (92.5 kg)  01/04/16 206 lb (93.4 kg)  10/31/15 209 lb 4 oz (94.9 kg)    Physical Exam  Constitutional: He appears well-developed and well-nourished. No distress.  HENT:  Mouth/Throat: Oropharynx is clear and moist. No oropharyngeal exudate.  Cardiovascular: Normal rate, regular rhythm, normal heart sounds and intact distal pulses.   No murmur heard. Pulmonary/Chest: Effort normal and breath sounds normal. No respiratory distress. He has no wheezes. He has no rales.  Genitourinary: Rectum normal and prostate  normal. Rectal exam shows no external hemorrhoid, no internal hemorrhoid, no fissure, no mass, no tenderness and anal tone normal. Prostate is not enlarged (15gm) and not tender.  Musculoskeletal: He exhibits no edema.  Skin: Skin is warm and dry. No rash noted.  Psychiatric: He has a normal mood and affect.  Nursing note and vitals reviewed.  Results for orders placed or performed in visit on 06/30/16  POCT Urinalysis Dipstick (Automated)  Result Value Ref Range   Color, UA Yellow    Clarity, UA Clear    Glucose, UA Negative    Bilirubin, UA Negative    Ketones, UA Negative    Spec Grav, UA 1.025    Blood, UA Negative    pH, UA 6.0    Protein, UA Negative    Urobilinogen, UA 0.2    Nitrite, UA Negative    Leukocytes, UA Negative Negative      Assessment & Plan:   Problem List Items Addressed This Visit    Erectile dysfunction - Primary    Discussed etiologies of erectile dysfunction - both organic and non organic.  UA/DRE reassuring today. Will trial viagra. Discussed side effects including HA, priapism, pt aware cannot take nitrates with medication.  Check T for endorsed decreased libido. Check PSA for longstanding propecia use. IPSS = 14/2.       Other Visit Diagnoses    Urinary frequency       Relevant Orders  POCT Urinalysis Dipstick (Automated) (Completed)   PSA   Decreased libido       Relevant Orders   Testosterone       Follow up plan: Return if symptoms worsen or fail to improve.  Ria Bush, MD

## 2016-06-30 NOTE — Assessment & Plan Note (Addendum)
Discussed etiologies of erectile dysfunction - both organic and non organic.  UA/DRE reassuring today. Will trial viagra. Discussed side effects including HA, priapism, pt aware cannot take nitrates with medication.  Check T for endorsed decreased libido. Check PSA for longstanding propecia use. IPSS = 14/2.

## 2016-06-30 NOTE — Patient Instructions (Signed)
Exam ok today. Urinalysis ok. Let's trial viagra - coupon provided today. Return this week for 7:30-8am blood work. Don't have to be fasting for this. Update with effect.

## 2016-07-01 ENCOUNTER — Other Ambulatory Visit (INDEPENDENT_AMBULATORY_CARE_PROVIDER_SITE_OTHER): Payer: 59

## 2016-07-01 DIAGNOSIS — R350 Frequency of micturition: Secondary | ICD-10-CM

## 2016-07-01 DIAGNOSIS — R6882 Decreased libido: Secondary | ICD-10-CM

## 2016-07-01 LAB — TESTOSTERONE: TESTOSTERONE: 393.89 ng/dL (ref 300.00–890.00)

## 2016-07-01 LAB — PSA: PSA: 0.42 ng/mL (ref 0.10–4.00)

## 2016-07-02 IMAGING — CR DG CHEST 2V
2 series · 2 of 2 positions shown · non-contrast
Comparison: None.

CLINICAL DATA: Chest pain

EXAM:
CHEST  2 VIEW

[chest pa]
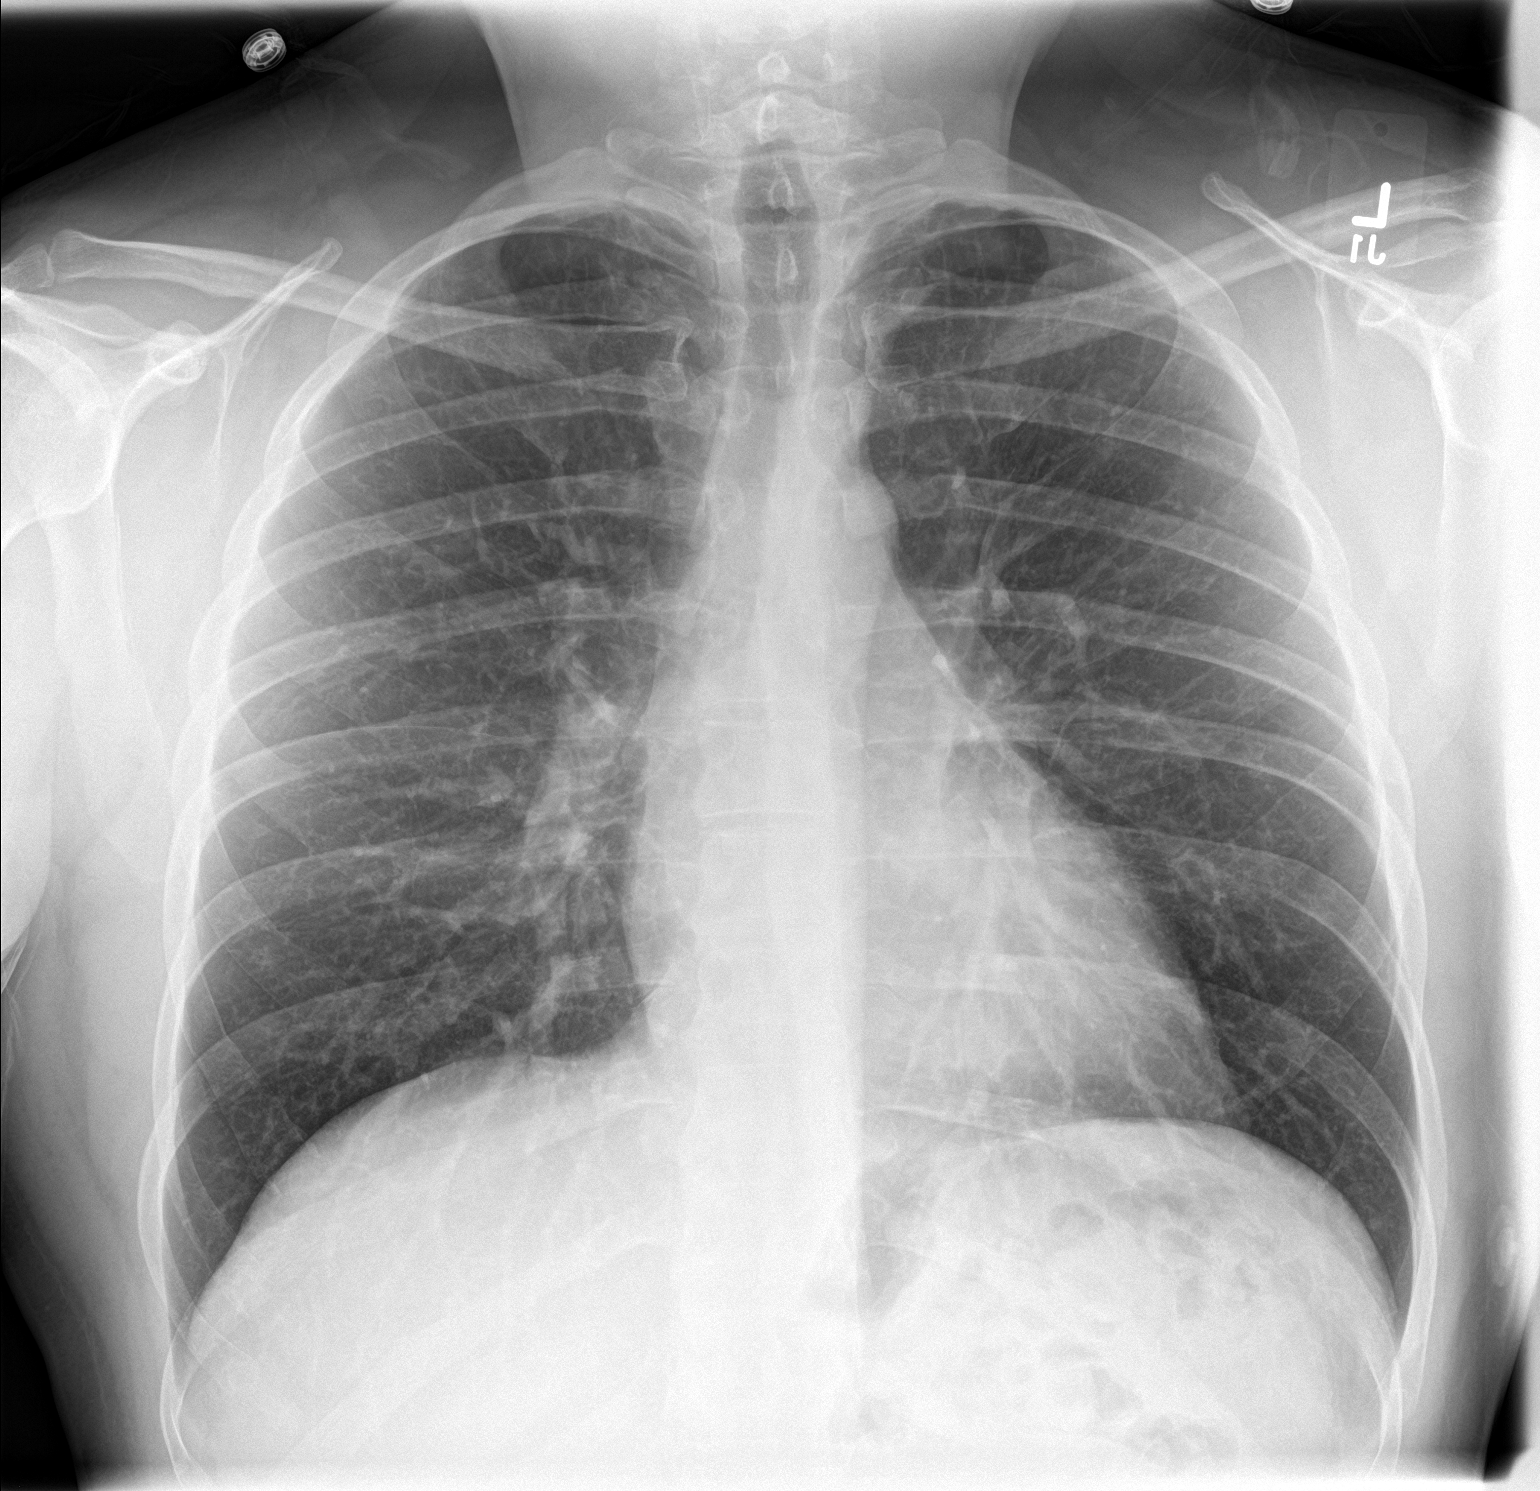

[chest lat]
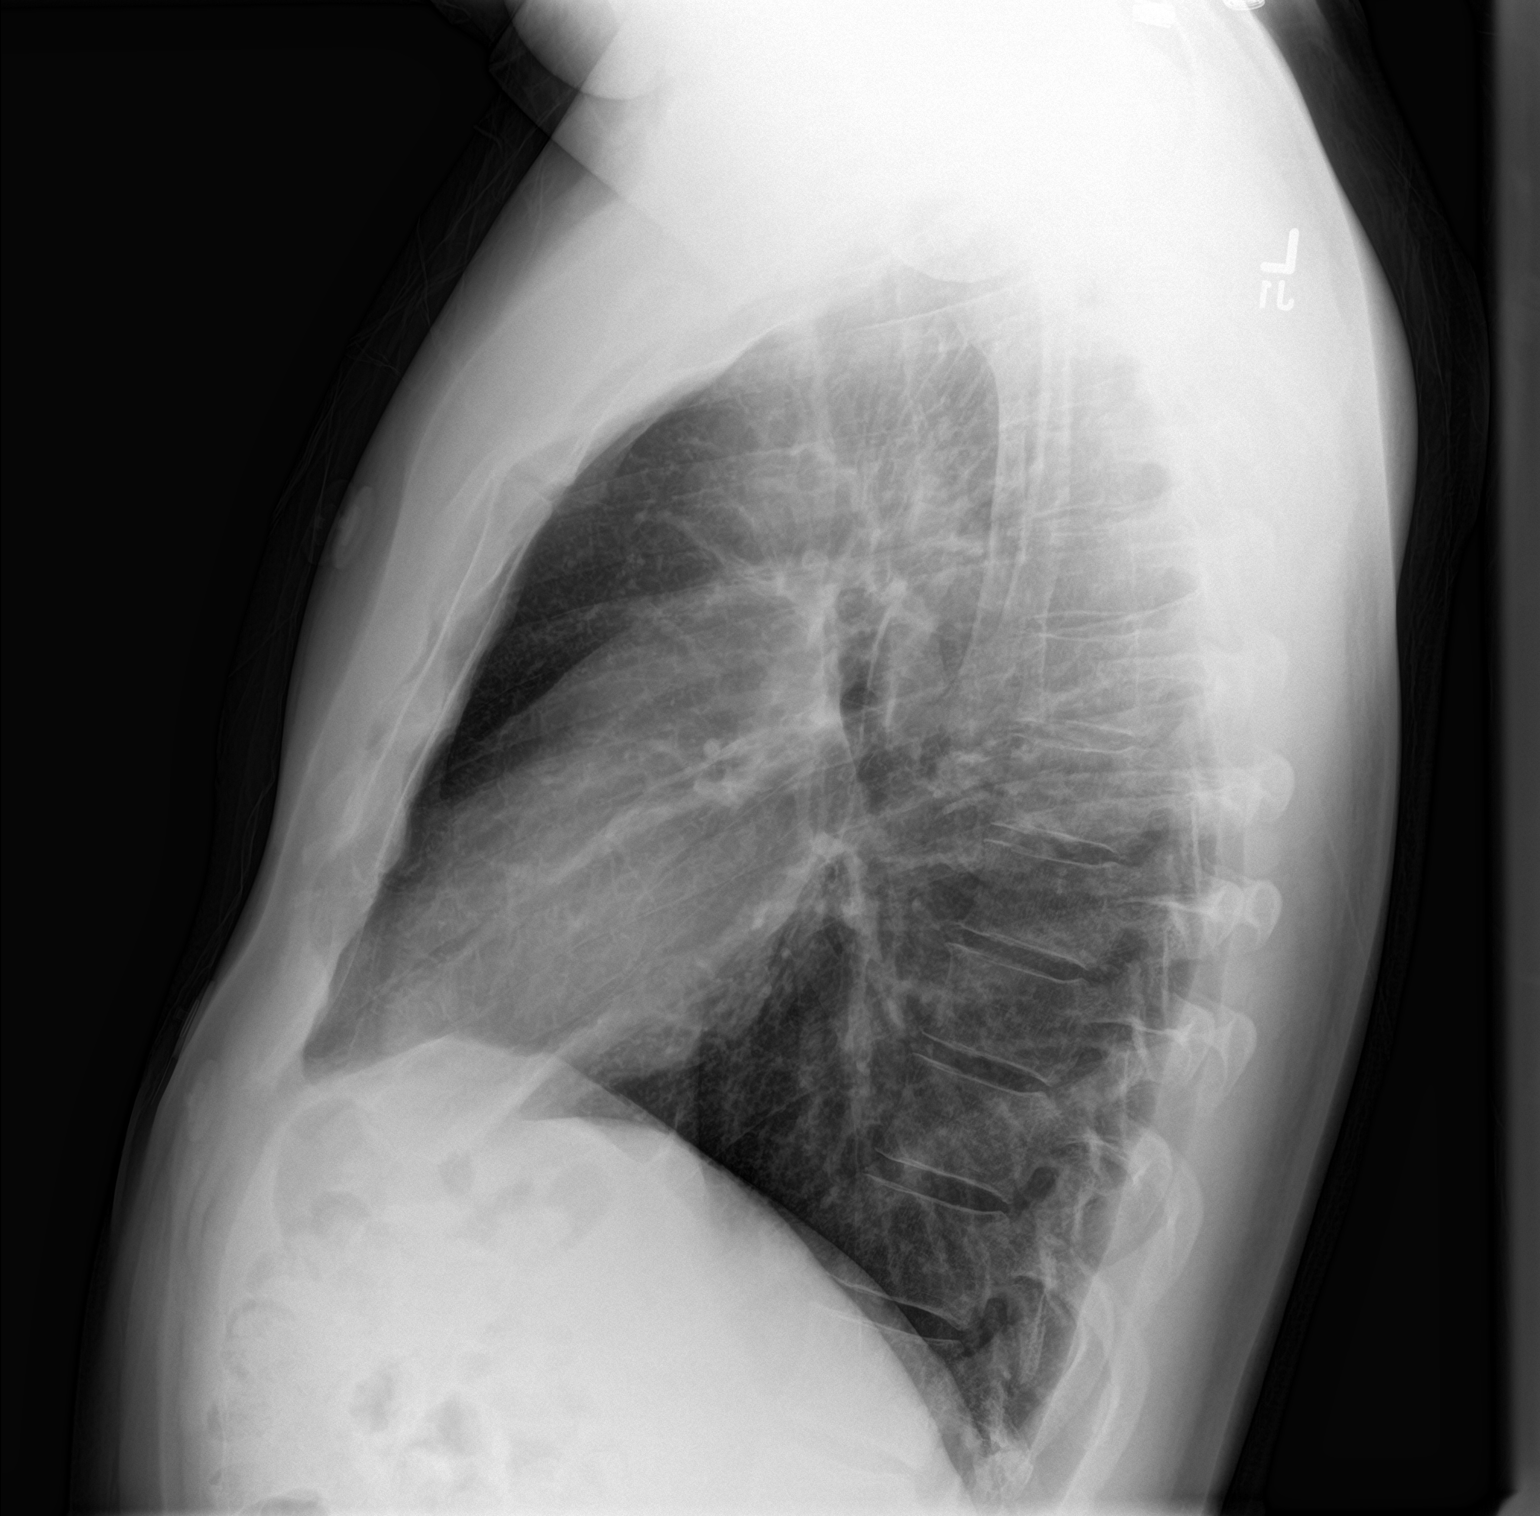

[2 of 2 positions shown; findings below may reference images not displayed]

FINDINGS: Heart size and vascularity normal. Negative for infiltrate effusion
or mass. Mild apical scarring bilaterally.
IMPRESSION: No active cardiopulmonary disease.

## 2016-09-22 ENCOUNTER — Encounter: Payer: Self-pay | Admitting: Primary Care

## 2016-09-22 ENCOUNTER — Ambulatory Visit (INDEPENDENT_AMBULATORY_CARE_PROVIDER_SITE_OTHER): Payer: 59 | Admitting: Primary Care

## 2016-09-22 VITALS — BP 122/82 | HR 82 | Temp 98.1°F | Ht 69.5 in | Wt 208.4 lb

## 2016-09-22 DIAGNOSIS — J069 Acute upper respiratory infection, unspecified: Secondary | ICD-10-CM | POA: Diagnosis not present

## 2016-09-22 MED ORDER — AMOXICILLIN-POT CLAVULANATE 875-125 MG PO TABS: 1.0000 | ORAL_TABLET | Freq: Two times a day (BID) | ORAL | 0 refills | Status: DC

## 2016-09-22 MED ORDER — HYDROCODONE-HOMATROPINE 5-1.5 MG/5ML PO SYRP: 5.0000 mL | ORAL_SOLUTION | Freq: Every evening | ORAL | 0 refills | Status: DC | PRN

## 2016-09-22 NOTE — Progress Notes (Signed)
Pre visit review using our clinic review tool, if applicable. No additional management support is needed unless otherwise documented below in the visit note. 

## 2016-09-22 NOTE — Progress Notes (Signed)
Subjective:    Patient ID: Raymond Nichols, male    DOB: Aug 31, 1971, 45 y.o.   MRN: [id number]  HPI  Raymond Nichols is a 45 year old male who presents today with a chief complaint of cough. He also reports chest congestion, nasal congestion, sinus pressure, sore throat. His symptoms have been present for the past 2 weeks. His cough is productive with brown/green sputum. He denies fevers, sick contacts. He's taken Tylenol Cold and Flu without much improvement.   Review of Systems  Constitutional: Positive for chills and fatigue. Negative for fever.  HENT: Positive for congestion and sinus pressure.   Respiratory: Positive for cough and shortness of breath. Negative for wheezing.   Cardiovascular: Negative for chest pain.  Gastrointestinal: Negative for nausea.       Past Medical History:  Diagnosis Date  . Androgenic alopecia      Social History   Social History  . Marital status: Married    Spouse name: N/A  . Number of children: N/A  . Years of education: N/A   Occupational History  . Not on file.   Social History Main Topics  . Smoking status: Never Smoker  . Smokeless tobacco: Never Used  . Alcohol use Yes     Comment: 2 beers once a week  . Drug use: No  . Sexual activity: Not on file   Other Topics Concern  . Not on file   Social History Narrative   Caffeine: 1 cup coffee   Lives with wife and father, no pets    Occupation: Freight forwarder for [employer]    Edu: college    Activity: gym 2-3 times a week, weights and cardio, running regularly    Diet: good water, some fruits/vegetables     No past surgical history on file.  Family History  Problem Relation Age of Onset  . Cancer Mother 84    lung (smoker)  . CAD Mother 79    stent (smoker)  . Hypertension Mother   . Diabetes Mother   . Cancer Father 65    colon  . Hyperlipidemia Father   . Hypertension Brother   . Diabetes Maternal Grandmother   . Alcohol abuse Maternal Grandfather   . Cancer Paternal  Grandfather     pancreatic    No Known Allergies  Current Outpatient Prescriptions on File Prior to Visit  Medication Sig Dispense Refill  . sildenafil (VIAGRA) 100 MG tablet Take 0.5-1 tablets (50-100 mg total) by mouth daily as needed for erectile dysfunction. (Patient not taking: Reported on 09/22/2016) 9 tablet 5   No current facility-administered medications on file prior to visit.     BP 122/82   Pulse 82   Temp 98.1 F (36.7 C) (Oral)   Ht 5' 9.5" (1.765 m)   Wt 208 lb 6.4 oz (94.5 kg)   SpO2 98%   BMI 30.33 kg/m    Objective:   Physical Exam  Constitutional: He appears well-nourished. He appears ill.  HENT:  Right Ear: Tympanic membrane and ear canal normal.  Left Ear: Tympanic membrane and ear canal normal.  Nose: Mucosal edema present. Right sinus exhibits no maxillary sinus tenderness and no frontal sinus tenderness. Left sinus exhibits no maxillary sinus tenderness and no frontal sinus tenderness.  Mouth/Throat: Oropharynx is clear and moist.  Eyes: Conjunctivae are normal.  Neck: Neck supple.  Cardiovascular: Normal rate and regular rhythm.   Pulmonary/Chest: Effort normal. He has no decreased breath sounds. He has rhonchi in the  right upper field, the right lower field, the left upper field and the left lower field. He has no rales.  Skin: Skin is warm and dry.          Assessment & Plan:  Acute Bronchitis:  Cough, congestion, fatigue, sinus pressure x 2 weeks. Overall started feeling worse 2 days ago. Exam with moderate rhonchi throughout, no wheezing or diminished sounds. Does appear acutely ill, not toxic. Give duration of symptoms and presentation will treat for presumed bacterial involvement.  Rx for Augmentin course sent to pharmacy. Rx printed for Hycodan. Discussed Mucinex DM. Fluids, rest follow up PRN.  Sheral Flow, NP

## 2016-09-22 NOTE — Patient Instructions (Signed)
Start Augmentin antibiotics. Take 1 tablet by mouth twice daily for 7 days.  You may take the Hycodan cough suppressant at bedtime as needed for cough and rest. Caution this medication contains codeine and will make you feel drowsy.  Cough/Congestion: Try taking Mucinex DM or Delsym DM. This will help loosen up the mucous in your chest. Ensure you take this medication with a full glass of water.  Ensure you are staying hydrated with water and rest.  It was a pleasure meeting you! Happy Thanksgiving!

## 2017-11-06 DIAGNOSIS — L812 Freckles: Secondary | ICD-10-CM | POA: Diagnosis not present

## 2017-11-06 DIAGNOSIS — D225 Melanocytic nevi of trunk: Secondary | ICD-10-CM | POA: Diagnosis not present

## 2017-11-06 DIAGNOSIS — L570 Actinic keratosis: Secondary | ICD-10-CM | POA: Diagnosis not present

## 2017-11-10 ENCOUNTER — Other Ambulatory Visit: Payer: Self-pay | Admitting: Family Medicine

## 2017-11-10 ENCOUNTER — Encounter: Payer: Self-pay | Admitting: Family Medicine

## 2017-11-10 DIAGNOSIS — E786 Lipoprotein deficiency: Secondary | ICD-10-CM

## 2017-11-10 DIAGNOSIS — R6882 Decreased libido: Secondary | ICD-10-CM

## 2017-11-12 ENCOUNTER — Ambulatory Visit (INDEPENDENT_AMBULATORY_CARE_PROVIDER_SITE_OTHER): Payer: 59 | Admitting: Family Medicine

## 2017-11-12 ENCOUNTER — Other Ambulatory Visit: Payer: 59

## 2017-11-12 ENCOUNTER — Encounter: Payer: Self-pay | Admitting: Internal Medicine

## 2017-11-12 ENCOUNTER — Encounter: Payer: Self-pay | Admitting: Family Medicine

## 2017-11-12 VITALS — BP 118/74 | HR 75 | Temp 98.0°F | Ht 70.5 in | Wt 200.5 lb

## 2017-11-12 DIAGNOSIS — Z800 Family history of malignant neoplasm of digestive organs: Secondary | ICD-10-CM | POA: Diagnosis not present

## 2017-11-12 DIAGNOSIS — Z1211 Encounter for screening for malignant neoplasm of colon: Secondary | ICD-10-CM | POA: Diagnosis not present

## 2017-11-12 DIAGNOSIS — J019 Acute sinusitis, unspecified: Secondary | ICD-10-CM | POA: Insufficient documentation

## 2017-11-12 DIAGNOSIS — Z0000 Encounter for general adult medical examination without abnormal findings: Secondary | ICD-10-CM

## 2017-11-12 DIAGNOSIS — K219 Gastro-esophageal reflux disease without esophagitis: Secondary | ICD-10-CM | POA: Diagnosis not present

## 2017-11-12 DIAGNOSIS — Z23 Encounter for immunization: Secondary | ICD-10-CM | POA: Diagnosis not present

## 2017-11-12 DIAGNOSIS — E786 Lipoprotein deficiency: Secondary | ICD-10-CM | POA: Diagnosis not present

## 2017-11-12 LAB — COMPREHENSIVE METABOLIC PANEL
ALT: 22 U/L (ref 0–53)
AST: 14 U/L (ref 0–37)
Albumin: 4.6 g/dL (ref 3.5–5.2)
Alkaline Phosphatase: 50 U/L (ref 39–117)
BILIRUBIN TOTAL: 0.5 mg/dL (ref 0.2–1.2)
BUN: 15 mg/dL (ref 6–23)
CO2: 30 meq/L (ref 19–32)
Calcium: 9.7 mg/dL (ref 8.4–10.5)
Chloride: 104 mEq/L (ref 96–112)
Creatinine, Ser: 1.09 mg/dL (ref 0.40–1.50)
GFR: 77.5 mL/min (ref >60.00)
GLUCOSE: 109 mg/dL — AB (ref 70–99)
Potassium: 4.4 mEq/L (ref 3.5–5.1)
SODIUM: 141 meq/L (ref 135–145)
Total Protein: 7.7 g/dL (ref 6.0–8.3)

## 2017-11-12 LAB — LIPID PANEL
CHOL/HDL RATIO: 4
Cholesterol: 148 mg/dL (ref 0–200)
HDL: 33.9 mg/dL — ABNORMAL LOW (ref >39.00)
LDL Cholesterol: 79 mg/dL (ref 0–99)
NONHDL: 113.64
Triglycerides: 171 mg/dL — ABNORMAL HIGH (ref 0.0–149.0)
VLDL: 34.2 mg/dL (ref 0.0–40.0)

## 2017-11-12 LAB — TSH: TSH: 2.23 u[IU]/mL (ref 0.35–4.50)

## 2017-11-12 MED ORDER — AMOXICILLIN 875 MG PO TABS: 875.0000 mg | ORAL_TABLET | Freq: Two times a day (BID) | ORAL | 0 refills | Status: DC

## 2017-11-12 NOTE — Assessment & Plan Note (Signed)
Preventative strategies reviewed. Discussed OTC PPI use.

## 2017-11-12 NOTE — Assessment & Plan Note (Signed)
Update FLP.  

## 2017-11-12 NOTE — Assessment & Plan Note (Signed)
With possible developing R otitis media. Rx amoxicillin 875mg  bid x 1 wk Update if not improving with treatment.

## 2017-11-12 NOTE — Addendum Note (Signed)
Addended by: Brenton Grills on: 1/48/4039 79:53 AM   Modules accepted: Orders

## 2017-11-12 NOTE — Patient Instructions (Addendum)
Tdap today.  Labs today. We will refer you for colonoscopy.  For sinus infection and possibly developing ear infection - treat with amoxicillin antibiotic for 7 days, let us know if not improving.  Return as needed or in 1-2 years for next physical.   Health Maintenance, Male A healthy lifestyle and preventive care is important for your health and wellness. Ask your health care provider about what schedule of regular examinations is right for you. What should I know about weight and diet? Eat a Healthy Diet  Eat plenty of vegetables, fruits, whole grains, low-fat dairy products, and lean protein.  Do not eat a lot of foods high in solid fats, added sugars, or salt.  Maintain a Healthy Weight Regular exercise can help you achieve or maintain a healthy weight. You should:  Do at least 150 minutes of exercise each week. The exercise should increase your heart rate and make you sweat (moderate-intensity exercise).  Do strength-training exercises at least twice a week.  Watch Your Levels of Cholesterol and Blood Lipids  Have your blood tested for lipids and cholesterol every 5 years starting at 46 years of age. If you are at high risk for heart disease, you should start having your blood tested when you are 46 years old. You may need to have your cholesterol levels checked more often if: ? Your lipid or cholesterol levels are high. ? You are older than 46 years of age. ? You are at high risk for heart disease.  What should I know about cancer screening? Many types of cancers can be detected early and may often be prevented. Lung Cancer  You should be screened every year for lung cancer if: ? You are a current smoker who has smoked for at least 30 years. ? You are a former smoker who has quit within the past 15 years.  Talk to your health care provider about your screening options, when you should start screening, and how often you should be screened.  Colorectal Cancer  Routine  colorectal cancer screening usually begins at 46 years of age and should be repeated every 5-10 years until you are 46 years old. You may need to be screened more often if early forms of precancerous polyps or small growths are found. Your health care provider may recommend screening at an earlier age if you have risk factors for colon cancer.  Your health care provider may recommend using home test kits to check for hidden blood in the stool.  A small camera at the end of a tube can be used to examine your colon (sigmoidoscopy or colonoscopy). This checks for the earliest forms of colorectal cancer.  Prostate and Testicular Cancer  Depending on your age and overall health, your health care provider may do certain tests to screen for prostate and testicular cancer.  Talk to your health care provider about any symptoms or concerns you have about testicular or prostate cancer.  Skin Cancer  Check your skin from head to toe regularly.  Tell your health care provider about any new moles or changes in moles, especially if: ? There is a change in a mole's size, shape, or color. ? You have a mole that is larger than a pencil eraser.  Always use sunscreen. Apply sunscreen liberally and repeat throughout the day.  Protect yourself by wearing long sleeves, pants, a wide-brimmed hat, and sunglasses when outside.  What should I know about heart disease, diabetes, and high blood pressure?  If you are  76-61 years of age, have your blood pressure checked every 3-5 years. If you are 71 years of age or older, have your blood pressure checked every year. You should have your blood pressure measured twice-once when you are at a hospital or clinic, and once when you are not at a hospital or clinic. Record the average of the two measurements. To check your blood pressure when you are not at a hospital or clinic, you can use: ? An automated blood pressure machine at a pharmacy. ? A home blood pressure  monitor.  Talk to your health care provider about your target blood pressure.  If you are between 2-76 years old, ask your health care provider if you should take aspirin to prevent heart disease.  Have regular diabetes screenings by checking your fasting blood sugar level. ? If you are at a normal weight and have a low risk for diabetes, have this test once every three years after the age of 56. ? If you are overweight and have a high risk for diabetes, consider being tested at a younger age or more often.  A one-time screening for abdominal aortic aneurysm (AAA) by ultrasound is recommended for men aged 59-75 years who are current or former smokers. What should I know about preventing infection? Hepatitis B If you have a higher risk for hepatitis B, you should be screened for this virus. Talk with your health care provider to find out if you are at risk for hepatitis B infection. Hepatitis C Blood testing is recommended for:  Everyone born from [birth year] through 1964.  Anyone with known risk factors for hepatitis C.  Sexually Transmitted Diseases (STDs)  You should be screened each year for STDs including gonorrhea and chlamydia if: ? You are sexually active and are younger than 46 years of age. ? You are older than 46 years of age and your health care provider tells you that you are at risk for this type of infection. ? Your sexual activity has changed since you were last screened and you are at an increased risk for chlamydia or gonorrhea. Ask your health care provider if you are at risk.  Talk with your health care provider about whether you are at high risk of being infected with HIV. Your health care provider may recommend a prescription medicine to help prevent HIV infection.  What else can I do?  Schedule regular health, dental, and eye exams.  Stay current with your vaccines (immunizations).  Do not use any tobacco products, such as cigarettes, chewing tobacco, and  e-cigarettes. If you need help quitting, ask your health care provider.  Limit alcohol intake to no more than 2 drinks per day. One drink equals 12 ounces of beer, 5 ounces of wine, or 1 ounces of hard liquor.  Do not use street drugs.  Do not share needles.  Ask your health care provider for help if you need support or information about quitting drugs.  Tell your health care provider if you often feel depressed.  Tell your health care provider if you have ever been abused or do not feel safe at home. This information is not intended to replace advice given to you by your health care provider. Make sure you discuss any questions you have with your health care provider. Document Released: 04/17/2008 Document Revised: 06/18/2016 Document Reviewed: 07/24/2015 Elsevier Interactive Patient Education  [employer].

## 2017-11-12 NOTE — Assessment & Plan Note (Signed)
Preventative protocols reviewed and updated unless pt declined. Discussed healthy diet and lifestyle.  

## 2017-11-12 NOTE — Progress Notes (Signed)
BP 118/74 (BP Location: Left Arm, Patient Position: Sitting, Cuff Size: Normal)   Pulse 75   Temp 98 F (36.7 C) (Oral)   Ht 5' 10.5" (1.791 m)   Wt 200 lb 8 oz (90.9 kg)   SpO2 97%   BMI 28.36 kg/m    CC: CPE Subjective:    Patient ID: Raymond Nichols, male    DOB: 05-Mar-1971, 47 y.o.   MRN: [id number]  HPI: Raymond Nichols is a 47 y.o. male presenting on 11/12/2017 for Annual Exam and Sinus Problem (Has brownish- green mucous and sinus pressure/congestion, worse at night. Sxs started 11/07/17. Tried Tylenol for fever, helped.)   Previously on propecia longterm for androgenic alopecia - stopped 2017.   Rough year - father passed away this year. Mother relocated to [facility]   5d h/o sinus congestion, pressure worse at night. Fever Monday 100.3, none since. Productive cough. Tried tylenol. Wife sick recently. Non smoker. No h/o asthma. No ear pain.   Noticing some acid reflux worse at night associated with certain foods.   Preventative: Colon cancer screening - father with h/o colon cancer. Will refer.  Flu shot declines as ill Tetanus - ~2008  Seat belt use discussed  Sunscreen use dicussed.No changing moles on skin. Sees derm Non smoker Alcohol - 1-2 drinks/wk  Caffeine: 1 cup coffee Lives with wife and father, no pets  Occupation: Freight forwarder for [employer]  Edu: college  Activity: no regular exercise, planning on returning to gym  Diet: good water, some fruits/vegetables   Relevant past medical, surgical, family and social history reviewed and updated as indicated. Interim medical history since our last visit reviewed. Allergies and medications reviewed and updated. Outpatient Medications Prior to Visit  Medication Sig Dispense Refill  . amoxicillin-clavulanate (AUGMENTIN) 875-125 MG tablet Take 1 tablet by mouth 2 (two) times daily. 14 tablet 0  . HYDROcodone-homatropine (HYCODAN) 5-1.5 MG/5ML syrup Take 5 mLs by mouth at bedtime as needed. 50 mL 0  . sildenafil (VIAGRA)  100 MG tablet Take 0.5-1 tablets (50-100 mg total) by mouth daily as needed for erectile dysfunction. 9 tablet 5   No facility-administered medications prior to visit.      Per HPI unless specifically indicated in ROS section below Review of Systems  Constitutional: Positive for chills and fever. Negative for activity change, appetite change, fatigue and unexpected weight change.  HENT: Positive for congestion and sinus pressure. Negative for hearing loss.   Eyes: Negative for visual disturbance.  Respiratory: Positive for cough. Negative for chest tightness, shortness of breath and wheezing.   Cardiovascular: Negative for chest pain, palpitations and leg swelling.  Gastrointestinal: Negative for abdominal distention, abdominal pain, blood in stool, constipation, diarrhea, nausea and vomiting.  Genitourinary: Negative for difficulty urinating and hematuria.  Musculoskeletal: Negative for arthralgias, myalgias and neck pain.  Skin: Negative for rash.  Neurological: Negative for dizziness, seizures, syncope and headaches.  Hematological: Negative for adenopathy. Does not bruise/bleed easily.  Psychiatric/Behavioral: Negative for dysphoric mood. The patient is not nervous/anxious.        Objective:    BP 118/74 (BP Location: Left Arm, Patient Position: Sitting, Cuff Size: Normal)   Pulse 75   Temp 98 F (36.7 C) (Oral)   Ht 5' 10.5" (1.791 m)   Wt 200 lb 8 oz (90.9 kg)   SpO2 97%   BMI 28.36 kg/m   Wt Readings from Last 3 Encounters:  11/12/17 200 lb 8 oz (90.9 kg)  09/22/16 208 lb 6.4 oz (94.5  kg)  06/30/16 204 lb (92.5 kg)    Physical Exam  Constitutional: He is oriented to person, place, and time. He appears well-developed and well-nourished. No distress.  HENT:  Head: Normocephalic and atraumatic.  Right Ear: Hearing, external ear and ear canal normal.  Left Ear: Hearing, tympanic membrane, external ear and ear canal normal.  Nose: Mucosal edema (nasal congestion)  present. No rhinorrhea. Right sinus exhibits no maxillary sinus tenderness and no frontal sinus tenderness. Left sinus exhibits no maxillary sinus tenderness and no frontal sinus tenderness.  Mouth/Throat: Uvula is midline, oropharynx is clear and moist and mucous membranes are normal. No oropharyngeal exudate, posterior oropharyngeal edema, posterior oropharyngeal erythema or tonsillar abscesses.  R TM erythematous with mild bulge but preserved light reflex  Eyes: Conjunctivae and EOM are normal. Pupils are equal, round, and reactive to light. No scleral icterus.  Neck: Normal range of motion. Neck supple. No thyromegaly present.  Cardiovascular: Normal rate, regular rhythm, normal heart sounds and intact distal pulses.  No murmur heard. Pulses:      Radial pulses are 2+ on the right side, and 2+ on the left side.  Pulmonary/Chest: Effort normal and breath sounds normal. No respiratory distress. He has no wheezes. He has no rales.  Abdominal: Soft. Bowel sounds are normal. He exhibits no distension and no mass. There is no tenderness. There is no rebound and no guarding.  Musculoskeletal: Normal range of motion. He exhibits no edema.  Lymphadenopathy:    He has no cervical adenopathy.  Neurological: He is alert and oriented to person, place, and time.  CN grossly intact, station and gait intact  Skin: Skin is warm and dry. No rash noted.  Psychiatric: He has a normal mood and affect. His behavior is normal. Judgment and thought content normal.  Nursing note and vitals reviewed.  Results for orders placed or performed in visit on 07/01/16  PSA  Result Value Ref Range   PSA 0.42 0.10 - 4.00 ng/mL  Testosterone  Result Value Ref Range   Testosterone 393.89 300.00 - 890.00 ng/dL      Assessment & Plan:   Problem List Items Addressed This Visit    Acute sinusitis    With possible developing R otitis media. Rx amoxicillin 875mg  bid x 1 wk Update if not improving with treatment.        Relevant Medications   amoxicillin (AMOXIL) 875 MG tablet   Family history of colon cancer in father   Relevant Orders   Ambulatory referral to Gastroenterology   GERD (gastroesophageal reflux disease)    Preventative strategies reviewed. Discussed OTC PPI use.       Health maintenance examination - Primary    Preventative protocols reviewed and updated unless pt declined. Discussed healthy diet and lifestyle.       Low HDL (under 40)    Update FLP.        Other Visit Diagnoses    Special screening for malignant neoplasms, colon       Relevant Orders   Ambulatory referral to Gastroenterology       Follow up plan: Return in about 1 year (around 11/12/2018) for annual exam, prior fasting for blood work.  Ria Bush, MD

## 2017-12-30 ENCOUNTER — Ambulatory Visit: Payer: 59 | Admitting: Internal Medicine

## 2017-12-30 ENCOUNTER — Encounter: Payer: Self-pay | Admitting: Internal Medicine

## 2017-12-30 VITALS — BP 98/60 | HR 64 | Ht 70.0 in | Wt 190.5 lb

## 2017-12-30 DIAGNOSIS — R12 Heartburn: Secondary | ICD-10-CM

## 2017-12-30 DIAGNOSIS — Z800 Family history of malignant neoplasm of digestive organs: Secondary | ICD-10-CM

## 2017-12-30 NOTE — Patient Instructions (Signed)
  You have been scheduled for a colonoscopy. Please follow written instructions given to you at your visit today.  Please pick up your prep supplies at the pharmacy. If you use inhalers (even only as needed), please bring them with you on the day of your procedure. Your physician has requested that you go to www.startemmi.com and enter the access code given to you at your visit today. This web site gives a general overview about your procedure. However, you should still follow specific instructions given to you by our office regarding your preparation for the procedure.     I appreciate the opportunity to care for you. Carl Gessner, MD, FACG 

## 2017-12-30 NOTE — Progress Notes (Signed)
   Raymond Nichols 47 y.o. Jan 13, 1971 [id number] Referred by: Ria Bush, MD  Assessment & Plan:   Encounter Diagnoses  Name Primary?  . Family history of colon cancer in father Yes  . Heartburn - occasional     Colonoscopy will be undertaken.  If negative given that his father was 36 I think he can wait 10 years for repeat colonoscopy.  If positive for polyps that we will determine the surveillance interval.  He will continue dietary modifications for his heartburn.   I appreciate the opportunity to care for this patient. CC: Ria Bush, MD   Subjective:   Chief Complaint: Family history of colon cancer  HPI The patient is a very nice middle-aged white man sent by Dr. Danise Mina because his father had colon cancer at the age of 46.  His father survived that but died of pulmonary fibrosis and pneumonia.  Patient and primary care provider questioning if he needs a colonoscopy at 31.  He denies any GI symptoms other than heartburn perhaps twice a month which if he avoids tomato sauce and tomato containing foods he is fine. No Known Allergies No outpatient medications have been marked as taking for the 12/30/17 encounter (Office Visit) with Gatha Mayer, MD.   Past Medical History:  Diagnosis Date  . Anal fissure 2009  . Androgenic alopecia    History reviewed. No pertinent surgical history. Social History   Social History Narrative   Caffeine: 1 cup coffee   Lives with wife  no pets no children   Occupation: Freight forwarder for [employer]    Edu: college    Activity: gym 2-3 times a week, weights and cardio, running regularly    Diet: good water, some fruits/vegetables    family history includes Alcohol abuse in his maternal grandfather; CAD (age of onset: 35) in his mother; Cancer in his paternal grandfather; Cancer (age of onset: 65) in his mother; Colon cancer (age of onset: 81) in his father; Diabetes in his maternal grandmother and mother; Hyperlipidemia in his  father; Hypertension in his brother and mother.   Review of Systems As per HPI.  Recent URI with fever.  Allergies sometimes.  Objective:   Physical Exam @BP  98/60   Pulse 64   Ht 5\' 10"  (1.778 m)   Wt 190 lb 8 oz (86.4 kg)   BMI 27.33 kg/m @  General:  Well-developed, well-nourished and in no acute distress Eyes:  anicteric. Lungs: Clear to auscultation bilaterally. Heart:  S1S2, no rubs, murmurs, gallops. Abdomen:  soft, non-tender, no hepatosplenomegaly, hernia, or mass and BS+.  Rectal:  Deferred until colonoscopy     Data Reviewed: Recent primary care note and labs in the EMR

## 2018-01-06 ENCOUNTER — Encounter: Payer: Self-pay | Admitting: Family Medicine

## 2018-01-06 ENCOUNTER — Ambulatory Visit: Payer: 59 | Admitting: Family Medicine

## 2018-01-06 VITALS — BP 118/74 | HR 80 | Temp 97.9°F | Wt 190.0 lb

## 2018-01-06 DIAGNOSIS — R3915 Urgency of urination: Secondary | ICD-10-CM

## 2018-01-06 DIAGNOSIS — N411 Chronic prostatitis: Secondary | ICD-10-CM | POA: Insufficient documentation

## 2018-01-06 LAB — POC URINALSYSI DIPSTICK (AUTOMATED)
BILIRUBIN UA: NEGATIVE
Blood, UA: NEGATIVE
GLUCOSE UA: NEGATIVE
Ketones, UA: NEGATIVE
Leukocytes, UA: NEGATIVE
NITRITE UA: NEGATIVE
PH UA: 6 (ref 5.0–8.0)
Protein, UA: NEGATIVE
Spec Grav, UA: >=1.03 — AB (ref 1.010–1.025)
Urobilinogen, UA: 0.2 E.U./dL

## 2018-01-06 MED ORDER — NAPROXEN 500 MG PO TABS: ORAL_TABLET | ORAL | 0 refills | Status: DC

## 2018-01-06 MED ORDER — SULFAMETHOXAZOLE-TRIMETHOPRIM 800-160 MG PO TABS: 1.0000 | ORAL_TABLET | Freq: Two times a day (BID) | ORAL | 0 refills | Status: DC

## 2018-01-06 NOTE — Patient Instructions (Signed)
Possible prostatitis - treat with bactrim 2 week course (twice daily). Also take naprosyn anti inflammatory twice daily with food for 5 days then as needed. If some improvement noted, we may extend antibiotic another 2 weeks.  If no better, let me know for referral to urologist.  Good to see you today, call us with questions.  Prostatitis Prostatitis is swelling or inflammation of the prostate gland. The prostate is a walnut-sized gland that is involved in the production of semen. It is located below a man's bladder, in front of the rectum. There are four types of prostatitis:  Chronic nonbacterial prostatitis. This is the most common type of prostatitis. It may be associated with a viral infection or autoimmune disorder.  Acute bacterial prostatitis. This is the least common type of prostatitis. It starts quickly and is usually associated with a bladder infection, high fever, and shaking chills. It can occur at any age.  Chronic bacterial prostatitis. This type usually results from acute bacterial prostatitis that happens repeatedly (is recurrent) or has not been treated properly. It can occur in men of any age but is most common among middle-aged men whose prostate has begun to get larger. The symptoms are not as severe as symptoms caused by acute bacterial prostatitis.  Prostatodynia or chronic pelvic pain syndrome (CPPS). This type is also called pelvic floor disorder. It is associated with increased muscular tone in the pelvis surrounding the prostate.  What are the causes? Bacterial prostatitis is caused by infection from bacteria. Chronic nonbacterial prostatitis may be caused by:  Urinary tract infections (UTIs).  Nerve damage.  A response by the body's disease-fighting system (autoimmune response).  Chemicals in the urine.  The causes of the other types of prostatitis are usually not known. What are the signs or symptoms? Symptoms of this condition vary depending upon the type  of prostatitis. If you have acute bacterial prostatitis, you may experience:  Urinary symptoms, such as: ? Painful urination. ? Burning during urination. ? Frequent and sudden urges to urinate. ? Inability to start urinating. ? A weak or interrupted stream of urine.  Vomiting.  Nausea.  Fever.  Chills.  Inability to empty the bladder completely.  Pain in the: ? Muscles or joints. ? Lower back. ? Lower abdomen.  If you have any of the other types of prostatitis, you may experience:  Urinary symptoms, such as: ? Sudden urges to urinate. ? Frequent urination. ? Difficulty starting urination. ? Weak urine stream. ? Dribbling after urination.  Discharge from the urethra. The urethra is a tube that opens at the end of the penis.  Pain in the: ? Testicles. ? Penis or tip of the penis. ? Rectum. ? Area in front of the rectum and below the scrotum (perineum).  Problems with sexual function.  Painful ejaculation.  Bloody semen.  How is this diagnosed? This condition may be diagnosed based on:  A physical and medical exam.  Your symptoms.  A urine test to check for bacteria.  An exam in which a health care provider uses a finger to feel the prostate (digital rectal exam).  A test of a sample of semen.  Blood tests.  Ultrasound.  Removal of prostate tissue to be examined under a microscope (biopsy).  Tests to check how your body handles urine (urodynamic tests).  A test to look inside your bladder or urethra (cystoscopy).  How is this treated? Treatment for this condition depends on the type of prostatitis. Treatment may involve:  Medicines to  relieve pain or inflammation.  Medicines to help relax your muscles.  Physical therapy.  Heat therapy.  Techniques to help you control certain body functions (biofeedback).  Relaxation exercises.  Antibiotic medicine, if your condition is caused by bacteria.  Warm water baths (sitz baths). Sitz baths  help with relaxing your pelvic floor muscles, which helps to relieve pressure on the prostate.  Follow these instructions at home:  Take over-the-counter and prescription medicines only as told by your health care provider.  If you were prescribed an antibiotic, take it as told by your health care provider. Do not stop taking the antibiotic even if you start to feel better.  If physical therapy, biofeedback, or relaxation exercises were prescribed, do exercises as instructed.  Take sitz baths as directed by your health care provider. For a sitz bath, sit in warm water that is deep enough to cover your hips and buttocks.  Keep all follow-up visits as told by your health care provider. This is important. Contact a health care provider if:  Your symptoms get worse.  You have a fever. Get help right away if:  You have chills.  You feel nauseous.  You vomit.  You feel light-headed or feel like you are going to faint.  You are unable to urinate.  You have blood or blood clots in your urine. This information is not intended to replace advice given to you by your health care provider. Make sure you discuss any questions you have with your health care provider. Document Released: 10/17/2000 Document Revised: 07/10/2016 Document Reviewed: 07/10/2016 Elsevier Interactive Patient Education  2017 [employer].

## 2018-01-06 NOTE — Progress Notes (Signed)
BP 118/74 (BP Location: Left Arm, Patient Position: Sitting, Cuff Size: Normal)   Pulse 80   Temp 97.9 F (36.6 C) (Oral)   Wt 190 lb (86.2 kg)   SpO2 99%   BMI 27.26 kg/m    CC: scrotal discomfort Subjective:    Patient ID: Raymond Nichols, male    DOB: 1971/03/10, 47 y.o.   MRN: [id number]  HPI: Raymond Nichols is a 47 y.o. male presenting on 01/06/2018 for Urinary Urgency (Also, c/o pressure/discomfort behind scrotum. Has c/o issue in the past. Urgency and pressure has worsened. Trouble getting stream started. And stream is not as strong. )   Ongoing urinary symptoms since 2017, worse over last 4-5 days especially this weekend. Describes pelvic pressure behind penis associated with urgency, weakening of urinary stream and some hesitancy, nocturia x1-2. Worse when laying down.   No fevers/chills, nausea, abd pain, dysuria, hematuria, urethral discharge, swollen glands.  Drinking good water. Drinks 1 cup coffee/day.   Recent amoxicillin course for sinusitis/acute otitis media 11/2017  Lab Results  Component Value Date   PSA 0.42 07/01/2016    Relevant past medical, surgical, family and social history reviewed and updated as indicated. Interim medical history since our last visit reviewed. Allergies and medications reviewed and updated. No outpatient medications prior to visit.   No facility-administered medications prior to visit.      Per HPI unless specifically indicated in ROS section below Review of Systems     Objective:    BP 118/74 (BP Location: Left Arm, Patient Position: Sitting, Cuff Size: Normal)   Pulse 80   Temp 97.9 F (36.6 C) (Oral)   Wt 190 lb (86.2 kg)   SpO2 99%   BMI 27.26 kg/m   Wt Readings from Last 3 Encounters:  01/06/18 190 lb (86.2 kg)  12/30/17 190 lb 8 oz (86.4 kg)  11/12/17 200 lb 8 oz (90.9 kg)    Physical Exam  Constitutional: He appears well-developed and well-nourished. No distress.  Abdominal: Soft. Normal appearance and bowel  sounds are normal. He exhibits no distension and no mass. There is no hepatosplenomegaly. There is no tenderness. There is no rigidity, no rebound, no guarding, no CVA tenderness and negative Murphy's sign. No hernia. Hernia confirmed negative in the right inguinal area and confirmed negative in the left inguinal area.  Genitourinary: Rectum normal, prostate normal, testes normal and penis normal. Rectal exam shows no external hemorrhoid, no internal hemorrhoid, no fissure, no mass, no tenderness and anal tone normal. Prostate is not enlarged (15gm) and not tender. Right testis shows no mass, no swelling and no tenderness. Right testis is descended. Left testis shows no mass, no swelling and no tenderness. Left testis is descended. Circumcised. No discharge found.  Musculoskeletal: He exhibits no edema.  Lymphadenopathy:       Right: No inguinal adenopathy present.       Left: No inguinal adenopathy present.  Nursing note and vitals reviewed.  Results for orders placed or performed in visit on 01/06/18  POCT Urinalysis Dipstick (Automated)  Result Value Ref Range   Color, UA yellow    Clarity, UA clear    Glucose, UA negative    Bilirubin, UA negative    Ketones, UA negative    Spec Grav, UA >=1.030 (A) 1.010 - 1.025   Blood, UA negative    pH, UA 6.0 5.0 - 8.0   Protein, UA negative    Urobilinogen, UA 0.2 0.2 or 1.0 E.U./dL   Nitrite, UA  negative    Leukocytes, UA Negative Negative      Assessment & Plan:   Problem List Items Addressed This Visit    Urinary urgency - Primary    Ongoing, acutely worse this week.  ?BPH or prostatitis related however no significant tenderness/bogginess or enlargement appreciated on DRE. UA concentrated but otherwise normal. Will treat empirically as prostatitis with 2 wk bactrim course. Will also start naprosyn 500mg  course x 5d then prn. If improvement noted, consider extending course. If no better, will recommend urology referral. Pt agrees with  plan. Reviewed avoiding bladder irritants. I-PSS score of 20/4 (progression from last time). Consider flomax trial.       Relevant Orders   POCT Urinalysis Dipstick (Automated) (Completed)   Urine Culture       Meds ordered this encounter  Medications  . sulfamethoxazole-trimethoprim (BACTRIM DS,SEPTRA DS) 800-160 MG tablet    Sig: Take 1 tablet by mouth 2 (two) times daily.    Dispense:  28 tablet    Refill:  0  . naproxen (NAPROSYN) 500 MG tablet    Sig: Take one po bid x 1 week then prn pain, take with food    Dispense:  40 tablet    Refill:  0   Orders Placed This Encounter  Procedures  . Urine Culture  . POCT Urinalysis Dipstick (Automated)    Follow up plan: Return if symptoms worsen or fail to improve.  Ria Bush, MD

## 2018-01-06 NOTE — Assessment & Plan Note (Addendum)
Ongoing, acutely worse this week.  ?BPH or prostatitis related however no significant tenderness/bogginess or enlargement appreciated on DRE. UA concentrated but otherwise normal. Will treat empirically as prostatitis with 2 wk bactrim course. Will also start naprosyn 500mg  course x 5d then prn. If improvement noted, consider extending course. If no better, will recommend urology referral. Pt agrees with plan. Reviewed avoiding bladder irritants. I-PSS score of 20/4 (progression from last time). Consider flomax trial.

## 2018-01-07 LAB — URINE CULTURE
MICRO NUMBER: 90288181
SPECIMEN QUALITY: ADEQUATE

## 2018-01-18 ENCOUNTER — Telehealth: Payer: Self-pay | Admitting: Family Medicine

## 2018-01-18 MED ORDER — SULFAMETHOXAZOLE-TRIMETHOPRIM 800-160 MG PO TABS: 1.0000 | ORAL_TABLET | Freq: Two times a day (BID) | ORAL | 0 refills | Status: DC

## 2018-01-18 NOTE — Telephone Encounter (Signed)
Pt was seen on 01/06/18 and if improvement would extend abx for 2 weeks. I spoke with pt and he has had improvement since taking abx given 01/06/18. Problem is on and off but not consistent and not as often. Pt wants to wait on taking flomax until finishes next round of abx and then discuss possibly taking flomax. [facility].

## 2018-01-18 NOTE — Telephone Encounter (Signed)
plz notify abx course extended. Thanks.

## 2018-01-18 NOTE — Telephone Encounter (Signed)
Left message on vm per dpr notifying pt abx was sent to [place]

## 2018-01-18 NOTE — Telephone Encounter (Signed)
Copied from [place]. Topic: Quick Communication - See Telephone Encounter >> Jan 18, 2018  9:30 AM Bea Graff, NT wrote: CRM for notification. See Telephone encounter for: Pt calling to let Dr. Danise Mina know that the antibiotic,  sulfamethoxazole-trimethoprim (BACTRIM DS,SEPTRA DS), and that he would be willing to take the additional 2 weeks worth.   01/18/18.

## 2018-03-01 ENCOUNTER — Telehealth: Payer: Self-pay

## 2018-03-01 DIAGNOSIS — R3915 Urgency of urination: Secondary | ICD-10-CM

## 2018-03-01 NOTE — Telephone Encounter (Signed)
Pt was seen 01/16/18; in office note had if not better may do urology referral.Please advise.

## 2018-03-01 NOTE — Telephone Encounter (Signed)
Copied from [place] [phone]. Topic: General - Other >> Mar 01, 2018  1:51 PM Yvette Rack wrote: Reason for CRM: patient states that he was on a antibiotic and he had finished it and the symptoms is coming back with groin area pressure and frequent urination

## 2018-03-02 NOTE — Telephone Encounter (Signed)
Attempted to contact pt. No answer. VM box is full.  Need to relay Dr. Synthia Innocent message.

## 2018-03-02 NOTE — Telephone Encounter (Signed)
Two options -  1. we can try extended 6 week course of stronger antibiotic ciprofloxacin bid - but need to watch for tendon pain on this medicine and if that happens needs to stop (small association between medication and tendon rupture). If no better with extended antibiotic, will refer to urology.  2. We can go ahead and refer to urology for further evaluation.

## 2018-03-03 NOTE — Telephone Encounter (Signed)
Attempted to contact pt.  No answer.  VM box full.  Need to relay Dr. Synthia Innocent message.

## 2018-03-04 MED ORDER — CIPROFLOXACIN HCL 500 MG PO TABS: 500.0000 mg | ORAL_TABLET | Freq: Two times a day (BID) | ORAL | 0 refills | Status: DC

## 2018-03-04 NOTE — Telephone Encounter (Addendum)
Left message on vm per dpr relaying message per Dr. G.  

## 2018-03-04 NOTE — Telephone Encounter (Signed)
Pt. Said he would like to try the antibiotic first.   He does have a new symptom - burns when urinates .   Please call and let him know if that would change anything

## 2018-03-04 NOTE — Telephone Encounter (Signed)
6 wk cipro course sent to pharmacy. Dysuria may be part of prostatitis. Lab Results  Component Value Date   CREATININE 1.09 11/12/2017

## 2018-03-05 NOTE — Telephone Encounter (Signed)
Pt called stating he has thought about it and he did pick up the RX for abx but it has some severe side effects. He would like to go ahead with a referral to Urology. Please f/u with pt when placed.

## 2018-03-07 NOTE — Telephone Encounter (Signed)
Ordered. Thanks

## 2018-03-07 NOTE — Addendum Note (Signed)
Addended by: Tonia Ghent on: 03/07/2018 01:00 PM   Modules accepted: Orders

## 2018-03-08 NOTE — Telephone Encounter (Signed)
Left detailed message on voicemail.  

## 2018-03-10 ENCOUNTER — Encounter: Payer: 59 | Admitting: Internal Medicine

## 2018-03-10 ENCOUNTER — Telehealth: Payer: Self-pay

## 2018-03-10 NOTE — Telephone Encounter (Signed)
Left message for patient to call [name] back in regards to a referral-Raymond Nichols, RMA   

## 2018-04-03 HISTORY — PX: COLONOSCOPY: SHX174

## 2018-04-06 DIAGNOSIS — R3912 Poor urinary stream: Secondary | ICD-10-CM | POA: Diagnosis not present

## 2018-04-06 DIAGNOSIS — R350 Frequency of micturition: Secondary | ICD-10-CM | POA: Diagnosis not present

## 2018-04-06 DIAGNOSIS — N411 Chronic prostatitis: Secondary | ICD-10-CM | POA: Diagnosis not present

## 2018-04-12 ENCOUNTER — Encounter: Payer: Self-pay | Admitting: Internal Medicine

## 2018-04-13 ENCOUNTER — Encounter: Payer: 59 | Admitting: Internal Medicine

## 2018-04-16 ENCOUNTER — Other Ambulatory Visit: Payer: Self-pay

## 2018-04-16 ENCOUNTER — Encounter: Payer: Self-pay | Admitting: Internal Medicine

## 2018-04-16 ENCOUNTER — Ambulatory Visit (AMBULATORY_SURGERY_CENTER): Payer: 59 | Admitting: Internal Medicine

## 2018-04-16 VITALS — BP 95/60 | HR 59 | Temp 99.1°F | Resp 18 | Ht 70.0 in | Wt 190.0 lb

## 2018-04-16 DIAGNOSIS — Z1211 Encounter for screening for malignant neoplasm of colon: Secondary | ICD-10-CM

## 2018-04-16 DIAGNOSIS — Z800 Family history of malignant neoplasm of digestive organs: Secondary | ICD-10-CM | POA: Diagnosis not present

## 2018-04-16 DIAGNOSIS — D124 Benign neoplasm of descending colon: Secondary | ICD-10-CM

## 2018-04-16 MED ORDER — SODIUM CHLORIDE 0.9 % IV SOLN: 500.0000 mL | Freq: Once | INTRAVENOUS | Status: DC

## 2018-04-16 NOTE — Patient Instructions (Addendum)
   I found and removed one tiny polyp.  I will let you know pathology results and when to have another routine colonoscopy by mail and/or My Chart.  I appreciate the opportunity to care for you. Gatha Mayer, MD, [facility]  Information on polyps given.  YOU HAD AN ENDOSCOPIC PROCEDURE TODAY AT [facility]:   Refer to the procedure report that was given to you for any specific questions about what was found during the examination.  If the procedure report does not answer your questions, please call your gastroenterologist to clarify.  If you requested that your care partner not be given the details of your procedure findings, then the procedure report has been included in a sealed envelope for you to review at your convenience later.  YOU SHOULD EXPECT: Some feelings of bloating in the abdomen. Passage of more gas than usual.  Walking can help get rid of the air that was put into your GI tract during the procedure and reduce the bloating. If you had a lower endoscopy (such as a colonoscopy or flexible sigmoidoscopy) you may notice spotting of blood in your stool or on the toilet paper. If you underwent a bowel prep for your procedure, you may not have a normal bowel movement for a few days.  Please Note:  You might notice some irritation and congestion in your nose or some drainage.  This is from the oxygen used during your procedure.  There is no need for concern and it should clear up in a day or so.  SYMPTOMS TO REPORT IMMEDIATELY:   Following lower endoscopy (colonoscopy or flexible sigmoidoscopy):  Excessive amounts of blood in the stool  Significant tenderness or worsening of abdominal pains  Swelling of the abdomen that is new, acute  Fever of 100F or higher  For urgent or emergent issues, a gastroenterologist can be reached at any hour by calling [phone].   DIET:  We do recommend a small meal at first, but then you may proceed to your regular diet.   Drink plenty of fluids but you should avoid alcoholic beverages for 24 hours.  ACTIVITY:  You should plan to take it easy for the rest of today and you should NOT DRIVE or use heavy machinery until tomorrow (because of the sedation medicines used during the test).    FOLLOW UP: Our staff will call the number listed on your records the next business day following your procedure to check on you and address any questions or concerns that you may have regarding the information given to you following your procedure. If we do not reach you, we will leave a message.  However, if you are feeling well and you are not experiencing any problems, there is no need to return our call.  We will assume that you have returned to your regular daily activities without incident.  If any biopsies were taken you will be contacted by phone or by letter within the next 1-3 weeks.  Please call us at [phone] if you have not heard about the biopsies in 3 weeks.    SIGNATURES/CONFIDENTIALITY: You and/or your care partner have signed paperwork which will be entered into your electronic medical record.  These signatures attest to the fact that that the information above on your After Visit Summary has been reviewed and is understood.  Full responsibility of the confidentiality of this discharge information lies with you and/or your care-partner.

## 2018-04-16 NOTE — Op Note (Signed)
[facility] Patient Name: Raymond Nichols Procedure Date: 04/16/2018 1:21 PM MRN: [id number] Endoscopist: Gatha Mayer , MD Age: 47 Referring MD:  Date of Birth: 1971-09-24 Gender: Male Account #: [id number] Procedure:                Colonoscopy Indications:              Screening in patient at increased risk: Colorectal                            cancer in father 3 or older Medicines:                Propofol per Anesthesia, Monitored Anesthesia Care Procedure:                Pre-Anesthesia Assessment:                           - Prior to the procedure, a History and Physical                            was performed, and patient medications and                            allergies were reviewed. The patient's tolerance of                            previous anesthesia was also reviewed. The risks                            and benefits of the procedure and the sedation                            options and risks were discussed with the patient.                            All questions were answered, and informed consent                            was obtained. Prior Anticoagulants: The patient has                            taken no previous anticoagulant or antiplatelet                            agents. ASA Grade Assessment: II - A patient with                            mild systemic disease. After reviewing the risks                            and benefits, the patient was deemed in                            satisfactory condition to undergo the procedure.  After obtaining informed consent, the colonoscope                            was passed under direct vision. Throughout the                            procedure, the patient's blood pressure, pulse, and                            oxygen saturations were monitored continuously. The                            Colonoscope was introduced through the anus and                            advanced  to the the cecum, identified by                            appendiceal orifice and ileocecal valve. The                            ileocecal valve, appendiceal orifice, and rectum                            were photographed. The quality of the bowel                            preparation was excellent. The bowel preparation                            used was Miralax. The colonoscopy was performed                            without difficulty. The patient tolerated the                            procedure well. Scope In: 1:39:55 PM Scope Out: 1:53:35 PM Scope Withdrawal Time: 0 hours 9 minutes 28 seconds  Total Procedure Duration: 0 hours 13 minutes 40 seconds  Findings:                 A 1 to 2 mm polyp was found in the descending                            colon. The polyp was sessile. The polyp was removed                            with a cold biopsy forceps. Resection and retrieval                            were complete. Verification of patient                            identification for the specimen was done. Estimated  blood loss was minimal.                           The exam was otherwise without abnormality on                            direct and retroflexion views. Complications:            No immediate complications. Estimated Blood Loss:     Estimated blood loss was minimal. Impression:               - One 1 to 2 mm polyp in the descending colon,                            removed with a cold biopsy forceps. Resected and                            retrieved.                           - The examination was otherwise normal on direct                            and retroflexion views. Recommendation:           - Patient has a contact number available for                            emergencies. The signs and symptoms of potential                            delayed complications were discussed with the                            patient. Return to  normal activities tomorrow.                            Written discharge instructions were provided to the                            patient.                           - Resume previous diet.                           - Continue present medications.                           - Repeat colonoscopy is recommended. The                            colonoscopy date will be determined after pathology                            results from today's exam become available for  review. Gatha Mayer, MD 04/16/2018 2:01:40 PM This report has been signed electronically.

## 2018-04-16 NOTE — Progress Notes (Signed)
Report to PACU, RN, vss, BBS= Clear.  

## 2018-04-16 NOTE — Progress Notes (Signed)
Called to room to assist during endoscopic procedure.  Patient ID and intended procedure confirmed with present staff. Received instructions for my participation in the procedure from the performing physician.  

## 2018-04-19 ENCOUNTER — Telehealth: Payer: Self-pay

## 2018-04-19 ENCOUNTER — Telehealth: Payer: Self-pay | Admitting: *Deleted

## 2018-04-19 NOTE — Telephone Encounter (Signed)
  Follow up Call-  Call back number 04/16/2018  Post procedure Call Back phone  # [phone]  Permission to leave phone message Yes  Some recent data might be hidden     Patient questions:  Do you have a fever, pain , or abdominal swelling? No. Pain Score  0 *  Have you tolerated food without any problems? Yes.    Have you been able to return to your normal activities? Yes.    Do you have any questions about your discharge instructions: Diet   No. Medications  No. Follow up visit  No.  Do you have questions or concerns about your Care? No.  Actions: * If pain score is 4 or above: No action needed, pain <4.

## 2018-04-19 NOTE — Telephone Encounter (Signed)
Left message on answering machine. 

## 2018-04-25 ENCOUNTER — Encounter: Payer: Self-pay | Admitting: Internal Medicine

## 2018-04-25 NOTE — Progress Notes (Signed)
Benign mucosal polyp 10 year recall My Chart letter

## 2018-05-10 DIAGNOSIS — H1045 Other chronic allergic conjunctivitis: Secondary | ICD-10-CM | POA: Diagnosis not present

## 2018-05-24 DIAGNOSIS — H1045 Other chronic allergic conjunctivitis: Secondary | ICD-10-CM | POA: Diagnosis not present

## 2018-07-08 DIAGNOSIS — N411 Chronic prostatitis: Secondary | ICD-10-CM | POA: Diagnosis not present

## 2018-07-08 DIAGNOSIS — E349 Endocrine disorder, unspecified: Secondary | ICD-10-CM | POA: Diagnosis not present

## 2018-07-30 DIAGNOSIS — Z23 Encounter for immunization: Secondary | ICD-10-CM | POA: Diagnosis not present

## 2018-11-14 ENCOUNTER — Encounter: Payer: Self-pay | Admitting: Family Medicine

## 2018-11-14 NOTE — Progress Notes (Signed)
BP 118/70 (BP Location: Left Arm, Patient Position: Sitting, Cuff Size: Normal)   Pulse 81   Temp 97.8 F (36.6 C) (Oral)   Ht 5\' 10"  (1.778 m)   Wt 203 lb 8 oz (92.3 kg)   SpO2 96%   BMI 29.20 kg/m    CC: CPE Subjective:    Patient ID: Raymond Nichols, male    DOB: 06-10-1971, 48 y.o.   MRN: [id number]  HPI: Raymond Nichols is a 48 y.o. male presenting on 11/15/2018 for Annual Exam   Treated for presumed prostatitis with bactrim 1 month course 01/2018. Symptoms recurred after treatment. Did not tolerate cipro course, referred to urology Dr Gloriann Loan dx chronic prostatitis s/p 1 mo doxycycline, rec PFPT and start flomax. He's also been taking prostate supplement. Wife recently diagnosed with breast cancer (Stage 2B) so he did not proceed with PT - she is doing well but currently going through chemo. Has f/u with urology 12/2018. Everything gives temporary relief. He continues flomax and saw palmetto.   Preventative: COLONOSCOPY 04/2018 - benign polyp, rpt 10 yrs Carlean Purl) Flu shot yearly Td 2008, Tdap 2019  Seat belt use discussed  Sunscreen use dicussed.No changing moles on skin. Sees derm.  Non smoker.  Alcohol - seldom  Dentist q6 mo  Eye exam yearly   Caffeine: 1 cup coffee Lives with wife and father, no pets  Occupation: Freight forwarder for [employer]  Edu: college  Activity: going to gym for strength training  Diet: good water, some fruits/vegetables      Relevant past medical, surgical, family and social history reviewed and updated as indicated. Interim medical history since our last visit reviewed. Allergies and medications reviewed and updated. Outpatient Medications Prior to Visit  Medication Sig Dispense Refill  . saw palmetto 160 MG capsule Take 160 mg by mouth daily.    . tamsulosin (FLOMAX) 0.4 MG CAPS capsule Take 0.4 mg by mouth daily.    . naproxen (NAPROSYN) 500 MG tablet Take one po bid x 1 week then prn pain, take with food 40 tablet 0  .  sulfamethoxazole-trimethoprim (BACTRIM DS,SEPTRA DS) 800-160 MG tablet Take 1 tablet by mouth 2 (two) times daily. (Patient not taking: Reported on 04/16/2018) 28 tablet 0   Facility-Administered Medications Prior to Visit  Medication Dose Route Frequency Provider Last Rate Last Dose  . 0.9 %  sodium chloride infusion  500 mL Intravenous Once Gatha Mayer, MD         Per HPI unless specifically indicated in ROS section below Review of Systems  Constitutional: Negative for activity change, appetite change, chills, fatigue, fever and unexpected weight change.  HENT: Positive for congestion. Negative for hearing loss.   Eyes: Negative for visual disturbance.  Respiratory: Negative for cough, chest tightness, shortness of breath and wheezing.   Cardiovascular: Negative for chest pain, palpitations and leg swelling.  Gastrointestinal: Negative for abdominal distention, abdominal pain, blood in stool, constipation, diarrhea, nausea and vomiting.  Genitourinary: Negative for difficulty urinating and hematuria.  Musculoskeletal: Negative for arthralgias, myalgias and neck pain.  Skin: Negative for rash.  Neurological: Negative for dizziness, seizures, syncope and headaches.  Hematological: Negative for adenopathy. Does not bruise/bleed easily.  Psychiatric/Behavioral: Negative for dysphoric mood. The patient is not nervous/anxious.    Objective:    BP 118/70 (BP Location: Left Arm, Patient Position: Sitting, Cuff Size: Normal)   Pulse 81   Temp 97.8 F (36.6 C) (Oral)   Ht 5\' 10"  (1.778 m)   Wt 203  lb 8 oz (92.3 kg)   SpO2 96%   BMI 29.20 kg/m   Wt Readings from Last 3 Encounters:  11/15/18 203 lb 8 oz (92.3 kg)  04/16/18 190 lb (86.2 kg)  01/06/18 190 lb (86.2 kg)    Physical Exam Vitals signs and nursing note reviewed.  Constitutional:      General: He is not in acute distress.    Appearance: He is well-developed.  HENT:     Head: Normocephalic and atraumatic.     Right Ear:  Hearing, tympanic membrane, ear canal and external ear normal.     Left Ear: Hearing, tympanic membrane, ear canal and external ear normal.     Nose: Nose normal.     Mouth/Throat:     Pharynx: Uvula midline. No oropharyngeal exudate or posterior oropharyngeal erythema.  Eyes:     General: No scleral icterus.    Conjunctiva/sclera: Conjunctivae normal.     Pupils: Pupils are equal, round, and reactive to light.  Neck:     Musculoskeletal: Normal range of motion and neck supple.  Cardiovascular:     Rate and Rhythm: Normal rate and regular rhythm.     Pulses:          Radial pulses are 2+ on the right side and 2+ on the left side.     Heart sounds: Normal heart sounds. No murmur.  Pulmonary:     Effort: Pulmonary effort is normal. No respiratory distress.     Breath sounds: Normal breath sounds. No wheezing or rales.  Abdominal:     General: Bowel sounds are normal. There is no distension.     Palpations: Abdomen is soft. There is no mass.     Tenderness: There is no abdominal tenderness. There is no guarding or rebound.  Musculoskeletal: Normal range of motion.  Lymphadenopathy:     Cervical: No cervical adenopathy.  Skin:    General: Skin is warm and dry.     Findings: No rash.  Neurological:     Mental Status: He is alert and oriented to person, place, and time.     Comments: CN grossly intact, station and gait intact  Psychiatric:        Behavior: Behavior normal.        Thought Content: Thought content normal.        Judgment: Judgment normal.       Results for orders placed or performed in visit on 01/06/18  Urine Culture  Result Value Ref Range   MICRO NUMBER: [id number]    SPECIMEN QUALITY: ADEQUATE    Sample Source URINE    STATUS: FINAL    ISOLATE 1:      Single organism less than 10,000 CFU/mL isolated. These organisms, commonly found on external and internal genitalia, are considered colonizers. No further testing performed.  POCT Urinalysis Dipstick  (Automated)  Result Value Ref Range   Color, UA yellow    Clarity, UA clear    Glucose, UA negative    Bilirubin, UA negative    Ketones, UA negative    Spec Grav, UA >=1.030 (A) 1.010 - 1.025   Blood, UA negative    pH, UA 6.0 5.0 - 8.0   Protein, UA negative    Urobilinogen, UA 0.2 0.2 or 1.0 E.U./dL   Nitrite, UA negative    Leukocytes, UA Negative Negative   Assessment & Plan:   Problem List Items Addressed This Visit    Hyperglycemia    Check A1c with labs  when he returns fasting.       Relevant Orders   Hemoglobin A1c   Health maintenance examination - Primary    Preventative protocols reviewed and updated unless pt declined. Discussed healthy diet and lifestyle.       Family history of colon cancer in father    Pt had benign colonoscopy 2019      Dyslipidemia    Update FLP when he returns fasting. Not fasting today.       Relevant Orders   Lipid panel   Comprehensive metabolic panel   Chronic prostatitis    Appreciate uro care. Continue flomax, saw palmetto. Has uro f/u next month.          No orders of the defined types were placed in this encounter.  Orders Placed This Encounter  Procedures  . Lipid panel    Standing Status:   Future    Standing Expiration Date:   11/16/2019  . Comprehensive metabolic panel    Standing Status:   Future    Standing Expiration Date:   11/16/2019  . Hemoglobin A1c    Standing Status:   Future    Standing Expiration Date:   11/16/2019    Follow up plan: Return in about 1 year (around 11/16/2019) for annual exam, prior fasting for blood work.  Ria Bush, MD

## 2018-11-15 ENCOUNTER — Ambulatory Visit (INDEPENDENT_AMBULATORY_CARE_PROVIDER_SITE_OTHER): Payer: 59 | Admitting: Family Medicine

## 2018-11-15 ENCOUNTER — Encounter: Payer: Self-pay | Admitting: Family Medicine

## 2018-11-15 VITALS — BP 118/70 | HR 81 | Temp 97.8°F | Ht 70.0 in | Wt 203.5 lb

## 2018-11-15 DIAGNOSIS — R739 Hyperglycemia, unspecified: Secondary | ICD-10-CM | POA: Insufficient documentation

## 2018-11-15 DIAGNOSIS — N411 Chronic prostatitis: Secondary | ICD-10-CM | POA: Diagnosis not present

## 2018-11-15 DIAGNOSIS — Z800 Family history of malignant neoplasm of digestive organs: Secondary | ICD-10-CM | POA: Diagnosis not present

## 2018-11-15 DIAGNOSIS — Z0000 Encounter for general adult medical examination without abnormal findings: Secondary | ICD-10-CM | POA: Diagnosis not present

## 2018-11-15 DIAGNOSIS — E785 Hyperlipidemia, unspecified: Secondary | ICD-10-CM | POA: Diagnosis not present

## 2018-11-15 NOTE — Assessment & Plan Note (Signed)
Appreciate uro care. Continue flomax, saw palmetto. Has uro f/u next month.

## 2018-11-15 NOTE — Patient Instructions (Addendum)
Good to see you today, call us with questions.  Return fasting for labs later this week.  Return as needed or in 1 year for next physical.   Health Maintenance, Male A healthy lifestyle and preventive care is important for your health and wellness. Ask your health care provider about what schedule of regular examinations is right for you. What should I know about weight and diet? Eat a Healthy Diet  Eat plenty of vegetables, fruits, whole grains, low-fat dairy products, and lean protein.  Do not eat a lot of foods high in solid fats, added sugars, or salt.  Maintain a Healthy Weight Regular exercise can help you achieve or maintain a healthy weight. You should:  Do at least 150 minutes of exercise each week. The exercise should increase your heart rate and make you sweat (moderate-intensity exercise).  Do strength-training exercises at least twice a week. Watch Your Levels of Cholesterol and Blood Lipids  Have your blood tested for lipids and cholesterol every 5 years starting at 47 years of age. If you are at high risk for heart disease, you should start having your blood tested when you are 47 years old. You may need to have your cholesterol levels checked more often if: ? Your lipid or cholesterol levels are high. ? You are older than 47 years of age. ? You are at high risk for heart disease. What should I know about cancer screening? Many types of cancers can be detected early and may often be prevented. Lung Cancer  You should be screened every year for lung cancer if: ? You are a current smoker who has smoked for at least 30 years. ? You are a former smoker who has quit within the past 15 years.  Talk to your health care provider about your screening options, when you should start screening, and how often you should be screened. Colorectal Cancer  Routine colorectal cancer screening usually begins at 47 years of age and should be repeated every 5-10 years until you are 47  years old. You may need to be screened more often if early forms of precancerous polyps or small growths are found. Your health care provider may recommend screening at an earlier age if you have risk factors for colon cancer.  Your health care provider may recommend using home test kits to check for hidden blood in the stool.  A small camera at the end of a tube can be used to examine your colon (sigmoidoscopy or colonoscopy). This checks for the earliest forms of colorectal cancer. Prostate and Testicular Cancer  Depending on your age and overall health, your health care provider may do certain tests to screen for prostate and testicular cancer.  Talk to your health care provider about any symptoms or concerns you have about testicular or prostate cancer. Skin Cancer  Check your skin from head to toe regularly.  Tell your health care provider about any new moles or changes in moles, especially if: ? There is a change in a mole's size, shape, or color. ? You have a mole that is larger than a pencil eraser.  Always use sunscreen. Apply sunscreen liberally and repeat throughout the day.  Protect yourself by wearing long sleeves, pants, a wide-brimmed hat, and sunglasses when outside. What should I know about heart disease, diabetes, and high blood pressure?  If you are 61-31 years of age, have your blood pressure checked every 3-5 years. If you are 33 years of age or older, have your  blood pressure checked every year. You should have your blood pressure measured twice-once when you are at a hospital or clinic, and once when you are not at a hospital or clinic. Record the average of the two measurements. To check your blood pressure when you are not at a hospital or clinic, you can use: ? An automated blood pressure machine at a pharmacy. ? A home blood pressure monitor.  Talk to your health care provider about your target blood pressure.  If you are between 55-2 years old, ask your  health care provider if you should take aspirin to prevent heart disease.  Have regular diabetes screenings by checking your fasting blood sugar level. ? If you are at a normal weight and have a low risk for diabetes, have this test once every three years after the age of 59. ? If you are overweight and have a high risk for diabetes, consider being tested at a younger age or more often.  A one-time screening for abdominal aortic aneurysm (AAA) by ultrasound is recommended for men aged 33-75 years who are current or former smokers. What should I know about preventing infection? Hepatitis B If you have a higher risk for hepatitis B, you should be screened for this virus. Talk with your health care provider to find out if you are at risk for hepatitis B infection. Hepatitis C Blood testing is recommended for:  Everyone born from [birth year] through 1964.  Anyone with known risk factors for hepatitis C. Sexually Transmitted Diseases (STDs)  You should be screened each year for STDs including gonorrhea and chlamydia if: ? You are sexually active and are younger than 47 years of age. ? You are older than 47 years of age and your health care provider tells you that you are at risk for this type of infection. ? Your sexual activity has changed since you were last screened and you are at an increased risk for chlamydia or gonorrhea. Ask your health care provider if you are at risk.  Talk with your health care provider about whether you are at high risk of being infected with HIV. Your health care provider may recommend a prescription medicine to help prevent HIV infection. What else can I do?  Schedule regular health, dental, and eye exams.  Stay current with your vaccines (immunizations).  Do not use any tobacco products, such as cigarettes, chewing tobacco, and e-cigarettes. If you need help quitting, ask your health care provider.  Limit alcohol intake to no more than 2 drinks per day. One drink  equals 12 ounces of beer, 5 ounces of wine, or 1 ounces of hard liquor.  Do not use street drugs.  Do not share needles.  Ask your health care provider for help if you need support or information about quitting drugs.  Tell your health care provider if you often feel depressed.  Tell your health care provider if you have ever been abused or do not feel safe at home. This information is not intended to replace advice given to you by your health care provider. Make sure you discuss any questions you have with your health care provider. Document Released: 04/17/2008 Document Revised: 06/18/2016 Document Reviewed: 07/24/2015 Elsevier Interactive Patient Education  2019 [employer].

## 2018-11-15 NOTE — Assessment & Plan Note (Signed)
Preventative protocols reviewed and updated unless pt declined. Discussed healthy diet and lifestyle.  

## 2018-11-15 NOTE — Assessment & Plan Note (Signed)
Check A1c with labs when he returns fasting.

## 2018-11-15 NOTE — Assessment & Plan Note (Signed)
Pt had benign colonoscopy 2019

## 2018-11-15 NOTE — Assessment & Plan Note (Signed)
Update FLP when he returns fasting. Not fasting today.

## 2018-11-16 ENCOUNTER — Other Ambulatory Visit (INDEPENDENT_AMBULATORY_CARE_PROVIDER_SITE_OTHER): Payer: 59

## 2018-11-16 DIAGNOSIS — R739 Hyperglycemia, unspecified: Secondary | ICD-10-CM

## 2018-11-16 DIAGNOSIS — E785 Hyperlipidemia, unspecified: Secondary | ICD-10-CM

## 2018-11-17 LAB — COMPREHENSIVE METABOLIC PANEL
ALT: 16 U/L (ref 0–53)
AST: 16 U/L (ref 0–37)
Albumin: 4.5 g/dL (ref 3.5–5.2)
Alkaline Phosphatase: 45 U/L (ref 39–117)
BUN: 17 mg/dL (ref 6–23)
CO2: 26 mEq/L (ref 19–32)
Calcium: 9.5 mg/dL (ref 8.4–10.5)
Chloride: 104 mEq/L (ref 96–112)
Creatinine, Ser: 1.1 mg/dL (ref 0.40–1.50)
GFR: 76.35 mL/min (ref >60.00)
GLUCOSE: 86 mg/dL (ref 70–99)
POTASSIUM: 4.3 meq/L (ref 3.5–5.1)
SODIUM: 136 meq/L (ref 135–145)
Total Bilirubin: 0.3 mg/dL (ref 0.2–1.2)
Total Protein: 7 g/dL (ref 6.0–8.3)

## 2018-11-17 LAB — LIPID PANEL
Cholesterol: 156 mg/dL (ref 0–200)
HDL: 28 mg/dL — ABNORMAL LOW (ref >39.00)
NonHDL: 128.01
Total CHOL/HDL Ratio: 6
Triglycerides: 316 mg/dL — ABNORMAL HIGH (ref 0.0–149.0)
VLDL: 63.2 mg/dL — AB (ref 0.0–40.0)

## 2018-11-17 LAB — LDL CHOLESTEROL, DIRECT: Direct LDL: 91 mg/dL

## 2018-11-17 LAB — HEMOGLOBIN A1C: HEMOGLOBIN A1C: 5.5 % (ref 4.6–6.5)

## 2018-12-07 DIAGNOSIS — R102 Pelvic and perineal pain: Secondary | ICD-10-CM | POA: Diagnosis not present

## 2018-12-07 DIAGNOSIS — N411 Chronic prostatitis: Secondary | ICD-10-CM | POA: Diagnosis not present

## 2019-02-21 DIAGNOSIS — J0100 Acute maxillary sinusitis, unspecified: Secondary | ICD-10-CM | POA: Diagnosis not present

## 2019-02-23 DIAGNOSIS — R110 Nausea: Secondary | ICD-10-CM | POA: Diagnosis not present

## 2019-05-04 DIAGNOSIS — J324 Chronic pansinusitis: Secondary | ICD-10-CM | POA: Insufficient documentation

## 2019-07-30 ENCOUNTER — Ambulatory Visit (INDEPENDENT_AMBULATORY_CARE_PROVIDER_SITE_OTHER): Payer: 59

## 2019-07-30 DIAGNOSIS — Z23 Encounter for immunization: Secondary | ICD-10-CM | POA: Diagnosis not present

## 2019-10-10 DIAGNOSIS — J343 Hypertrophy of nasal turbinates: Secondary | ICD-10-CM | POA: Insufficient documentation

## 2019-10-10 DIAGNOSIS — J3089 Other allergic rhinitis: Secondary | ICD-10-CM | POA: Insufficient documentation

## 2019-10-10 DIAGNOSIS — J342 Deviated nasal septum: Secondary | ICD-10-CM | POA: Insufficient documentation

## 2019-11-15 ENCOUNTER — Telehealth: Payer: Self-pay | Admitting: Family Medicine

## 2019-11-15 NOTE — Telephone Encounter (Signed)
Routing to Dr. Danise Mina for review/approval.

## 2019-11-15 NOTE — Telephone Encounter (Signed)
Okay for transfer 

## 2019-11-15 NOTE — Telephone Encounter (Signed)
OK with me.

## 2019-11-15 NOTE — Telephone Encounter (Signed)
Ok to do - very pleasant patient!

## 2019-11-15 NOTE — Telephone Encounter (Signed)
Pt has been scheduled for transfer of care appt, 12/13/19

## 2019-11-15 NOTE — Telephone Encounter (Signed)
Pt is requesting to transfer FROM: Dr. Danise Mina Pt is requesting to transfer TO: Dr. Anitra Lauth Reason for requested transfer: Moved to [facility] area Best number: [phone]  Thank you.

## 2019-11-18 ENCOUNTER — Encounter: Payer: 59 | Admitting: Family Medicine

## 2019-12-12 ENCOUNTER — Other Ambulatory Visit: Payer: Self-pay

## 2019-12-13 ENCOUNTER — Encounter: Payer: Self-pay | Admitting: Family Medicine

## 2019-12-13 ENCOUNTER — Ambulatory Visit (INDEPENDENT_AMBULATORY_CARE_PROVIDER_SITE_OTHER): Payer: 59 | Admitting: Family Medicine

## 2019-12-13 ENCOUNTER — Other Ambulatory Visit: Payer: Self-pay

## 2019-12-13 VITALS — BP 118/80 | HR 64 | Temp 97.7°F | Resp 16 | Ht 70.0 in | Wt 202.0 lb

## 2019-12-13 DIAGNOSIS — R7301 Impaired fasting glucose: Secondary | ICD-10-CM | POA: Diagnosis not present

## 2019-12-13 DIAGNOSIS — Z0000 Encounter for general adult medical examination without abnormal findings: Secondary | ICD-10-CM | POA: Diagnosis not present

## 2019-12-13 DIAGNOSIS — Z125 Encounter for screening for malignant neoplasm of prostate: Secondary | ICD-10-CM | POA: Diagnosis not present

## 2019-12-13 LAB — LIPID PANEL
Cholesterol: 144 mg/dL (ref 0–200)
HDL: 29.4 mg/dL — ABNORMAL LOW (ref >39.00)
LDL Cholesterol: 89 mg/dL (ref 0–99)
NonHDL: 114.71
Total CHOL/HDL Ratio: 5
Triglycerides: 129 mg/dL (ref 0.0–149.0)
VLDL: 25.8 mg/dL (ref 0.0–40.0)

## 2019-12-13 LAB — COMPREHENSIVE METABOLIC PANEL
ALT: 17 U/L (ref 0–53)
AST: 15 U/L (ref 0–37)
Albumin: 4.5 g/dL (ref 3.5–5.2)
Alkaline Phosphatase: 40 U/L (ref 39–117)
BUN: 14 mg/dL (ref 6–23)
CO2: 29 mEq/L (ref 19–32)
Calcium: 9.6 mg/dL (ref 8.4–10.5)
Chloride: 105 mEq/L (ref 96–112)
Creatinine, Ser: 1.03 mg/dL (ref 0.40–1.50)
GFR: 77.14 mL/min (ref >60.00)
Glucose, Bld: 92 mg/dL (ref 70–99)
Potassium: 4.7 mEq/L (ref 3.5–5.1)
Sodium: 139 mEq/L (ref 135–145)
Total Bilirubin: 0.7 mg/dL (ref 0.2–1.2)
Total Protein: 6.7 g/dL (ref 6.0–8.3)

## 2019-12-13 LAB — CBC WITH DIFFERENTIAL/PLATELET
Basophils Absolute: 0.1 10*3/uL (ref 0.0–0.1)
Basophils Relative: 1.1 % (ref 0.0–3.0)
Eosinophils Absolute: 0.1 10*3/uL (ref 0.0–0.7)
Eosinophils Relative: 2.7 % (ref 0.0–5.0)
HCT: 44.9 % (ref 39.0–52.0)
Hemoglobin: 15.1 g/dL (ref 13.0–17.0)
Lymphocytes Relative: 30 % (ref 12.0–46.0)
Lymphs Abs: 1.6 10*3/uL (ref 0.7–4.0)
MCHC: 33.5 g/dL (ref 30.0–36.0)
MCV: 89.9 fl (ref 78.0–100.0)
Monocytes Absolute: 0.4 10*3/uL (ref 0.1–1.0)
Monocytes Relative: 7.8 % (ref 3.0–12.0)
Neutro Abs: 3.2 10*3/uL (ref 1.4–7.7)
Neutrophils Relative %: 58.4 % (ref 43.0–77.0)
Platelets: 254 10*3/uL (ref 150.0–400.0)
RBC: 5 Mil/uL (ref 4.22–5.81)
RDW: 13.2 % (ref 11.5–15.5)
WBC: 5.5 10*3/uL (ref 4.0–10.5)

## 2019-12-13 LAB — HEMOGLOBIN A1C: Hgb A1c MFr Bld: 5.4 % (ref 4.6–6.5)

## 2019-12-13 LAB — TSH: TSH: 0.92 u[IU]/mL (ref 0.35–4.50)

## 2019-12-13 NOTE — Progress Notes (Signed)
Office Note 12/13/2019  CC:  Chief Complaint  Patient presents with  . Establish Care    Previous PCP, Dr.Gutierrez    HPI: Raymond Nichols is a 49 y.o. male who is here to establish care/transfer and get annual health maintenance exam. Patient's most recent primary MD: Dr. Danise Mina Old records in EPIC/[facility] EMR were reviewed prior to or during today's visit.  Relocated to [facility], 09/2019. Orig from [place].  Exercise: regularly in the past, none in last 4 mo or so. Diet:  Healthier habits since new year's, has lost 12 lbs.  Past Medical History:  Diagnosis Date  . Allergic rhinitis    [facility] in [place].  Dust mites and mold--to start immunotherapy as of 12/2019.  Marland Kitchen Anal fissure 2009  . Androgenic alopecia   . Chronic pansinusitis    ENT Dr. Janace Hoard  . Family history of colon cancer in father    q 5 yr colonoscopy  . GERD (gastroesophageal reflux disease)   . History of prostatitis     Past Surgical History:  Procedure Laterality Date  . COLONOSCOPY  04/2018   benign polyp, +FH father--rpt 5 yrs Carlean Purl)    Family History  Problem Relation Age of Onset  . Cancer Mother 91       lung (smoker)  . CAD Mother 46       stent (smoker)  . Hypertension Mother   . Diabetes Mother   . Hyperlipidemia Father   . Colon cancer Father 74  . Hypertension Brother   . Diabetes Maternal Grandmother   . Alcohol abuse Maternal Grandfather   . Cancer Paternal Grandfather        pancreatic  . Stomach cancer Neg Hx     Social History   Socioeconomic History  . Marital status: Married    Spouse name: Not on file  . Number of children: 0  . Years of education: Not on file  . Highest education level: Not on file  Occupational History    Employer: [employer]  Tobacco Use  . Smoking status: Never Smoker  . Smokeless tobacco: Never Used  Substance and Sexual Activity  . Alcohol use: Yes    Comment: 2 beers once a week  . Drug use: No  .  Sexual activity: Not on file  Other Topics Concern  . 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   Activity: gym 2-3 times a week, weights and cardio, running regularly    Diet: good water, some fruits/vegetables    Social Determinants of Health   Financial Resource Strain:   . Difficulty of Paying Living Expenses: Not on file  Food Insecurity:   . Worried About Charity fundraiser in the Last Year: Not on file  . Ran Out of Food in the Last Year: Not on file  Transportation Needs:   . Lack of Transportation (Medical): Not on file  . Lack of Transportation (Non-Medical): Not on file  Physical Activity:   . Days of Exercise per Week: Not on file  . Minutes of Exercise per Session: Not on file  Stress:   . Feeling of Stress : Not on file  Social Connections:   . Frequency of Communication with Friends and Family: Not on file  . Frequency of Social Gatherings  with Friends and Family: Not on file  . Attends Religious Services: Not on file  . Active Member of Clubs or Organizations: Not on file  . Attends Archivist Meetings: Not on file  . Marital Status: Not on file  Intimate Partner Violence:   . Fear of Current or Ex-Partner: Not on file  . Emotionally Abused: Not on file  . Physically Abused: Not on file  . Sexually Abused: Not on file    Outpatient Encounter Medications as of 12/13/2019  Medication Sig  . cetirizine (ZYRTEC) 10 MG tablet Take 10 mg by mouth daily.  . Azelastine-Fluticasone 137-50 MCG/ACT SUSP Place 1 spray into both nostrils 2 (two) times daily.  . mometasone (NASONEX) 50 MCG/ACT nasal spray Place into the nose.  . [DISCONTINUED] saw palmetto 160 MG capsule Take 160 mg by mouth daily.  . [DISCONTINUED] tamsulosin (FLOMAX) 0.4 MG CAPS capsule Take 0.4 mg by mouth daily.   Facility-Administered  Encounter Medications as of 12/13/2019  Medication  . 0.9 %  sodium chloride infusion    Allergies  Allergen Reactions  . Molds & Smuts    ROS Review of Systems  Constitutional: Negative for appetite change, chills, fatigue and fever.  HENT: Negative for congestion, dental problem, ear pain and sore throat.   Eyes: Negative for discharge, redness and visual disturbance.  Respiratory: Negative for cough, chest tightness, shortness of breath and wheezing.   Cardiovascular: Negative for chest pain, palpitations and leg swelling.  Gastrointestinal: Negative for abdominal pain, blood in stool, diarrhea, nausea and vomiting.  Genitourinary: Negative for difficulty urinating, dysuria, flank pain, frequency, hematuria and urgency.  Musculoskeletal: Negative for arthralgias, back pain, joint swelling, myalgias and neck stiffness.  Skin: Negative for pallor and rash.  Neurological: Negative for dizziness, speech difficulty, weakness and headaches.  Hematological: Negative for adenopathy. Does not bruise/bleed easily.  Psychiatric/Behavioral: Negative for confusion and sleep disturbance. The patient is not nervous/anxious.     PE; Blood pressure 118/80, pulse 64, temperature 97.7 F (36.5 C), temperature source Temporal, resp. rate 16, height 5\' 10"  (1.778 m), weight 202 lb (91.6 kg), SpO2 100 %. Body mass index is 28.98 kg/m.  Gen: Alert, well appearing.  Patient is oriented to person, place, time, and situation. AFFECT: pleasant, lucid thought and speech. ENT: Ears: EACs clear, normal epithelium.  TMs with good light reflex and landmarks bilaterally.  Eyes: no injection, icteris, swelling, or exudate.  EOMI, PERRLA. Nose: no drainage or turbinate edema/swelling.  No injection or focal lesion.  Mouth: lips without lesion/swelling.  Oral mucosa pink and moist.  Dentition intact and without obvious caries or gingival swelling.  Oropharynx without erythema, exudate, or swelling.  Neck:  supple/nontender.  No LAD, mass, or TM.  Carotid pulses 2+ bilaterally, without bruits. CV: RRR, no m/r/g.   LUNGS: CTA bilat, nonlabored resps, good aeration in all lung fields. ABD: soft, NT, ND, BS normal.  No hepatospenomegaly or mass.  No bruits. EXT: no clubbing, cyanosis, or edema.  Musculoskeletal: no joint swelling, erythema, warmth, or tenderness.  ROM of all joints intact. Skin - no sores or suspicious lesions or rashes or color changes  Pertinent labs:  Lab Results  Component Value Date   TSH 2.23 11/12/2017   Lab Results  Component Value Date   WBC 5.7 10/01/2015   HGB 15.9 10/01/2015   HCT 46.1 10/01/2015   MCV 89.2 10/01/2015   PLT 221 10/01/2015   Lab Results  Component Value Date   CREATININE 1.10  11/16/2018   BUN 17 11/16/2018   NA 136 11/16/2018   K 4.3 11/16/2018   CL 104 11/16/2018   CO2 26 11/16/2018   Lab Results  Component Value Date   ALT 16 11/16/2018   AST 16 11/16/2018   ALKPHOS 45 11/16/2018   BILITOT 0.3 11/16/2018   Lab Results  Component Value Date   CHOL 156 11/16/2018   Lab Results  Component Value Date   HDL 28.00 (L) 11/16/2018   Lab Results  Component Value Date   LDLCALC 79 11/12/2017   Lab Results  Component Value Date   TRIG 316.0 (H) 11/16/2018   Lab Results  Component Value Date   CHOLHDL 6 11/16/2018   Lab Results  Component Value Date   PSA 0.42 07/01/2016   Lab Results  Component Value Date   HGBA1C 5.5 11/16/2018    ASSESSMENT AND PLAN:   New pt/transfer pt:  Health maintenance exam: Reviewed age and gender appropriate health maintenance issues (prudent diet, regular exercise, health risks of tobacco and excessive alcohol, use of seatbelts, fire alarms in home, use of sunscreen).  Also reviewed age and gender appropriate health screening as well as vaccine recommendations. Vaccines: Tdap and flu UTD. Labs: fasting HP and HbA1c (IFG) Prostate ca screening: average risk patient= as per latest  guidelines, start screening at 67 yrs of age. Colon ca screening: rpt colonoscopy due 2029.  An After Visit Summary was printed and given to the patient.  Return in about 1 year (around 12/12/2020).  Signed:  Crissie Sickles, MD           12/13/2019

## 2019-12-13 NOTE — Patient Instructions (Signed)

## 2020-08-17 ENCOUNTER — Other Ambulatory Visit: Payer: Self-pay

## 2020-08-17 ENCOUNTER — Ambulatory Visit (INDEPENDENT_AMBULATORY_CARE_PROVIDER_SITE_OTHER): Payer: 59

## 2020-08-17 ENCOUNTER — Encounter: Payer: Self-pay | Admitting: Family Medicine

## 2020-08-17 DIAGNOSIS — Z23 Encounter for immunization: Secondary | ICD-10-CM

## 2020-08-28 ENCOUNTER — Encounter: Payer: Self-pay | Admitting: Family Medicine

## 2020-08-28 ENCOUNTER — Telehealth (INDEPENDENT_AMBULATORY_CARE_PROVIDER_SITE_OTHER): Payer: 59 | Admitting: Family Medicine

## 2020-08-28 ENCOUNTER — Telehealth: Payer: 59 | Admitting: Family Medicine

## 2020-08-28 VITALS — Temp 98.0°F | Wt 188.0 lb

## 2020-08-28 DIAGNOSIS — R059 Cough, unspecified: Secondary | ICD-10-CM

## 2020-08-28 DIAGNOSIS — R0981 Nasal congestion: Secondary | ICD-10-CM

## 2020-08-28 DIAGNOSIS — R519 Headache, unspecified: Secondary | ICD-10-CM

## 2020-08-28 MED ORDER — BENZONATATE 100 MG PO CAPS: 100.0000 mg | ORAL_CAPSULE | Freq: Three times a day (TID) | ORAL | 0 refills | Status: DC | PRN

## 2020-08-28 MED ORDER — AMOXICILLIN-POT CLAVULANATE 875-125 MG PO TABS: 1.0000 | ORAL_TABLET | Freq: Two times a day (BID) | ORAL | 0 refills | Status: DC

## 2020-08-28 NOTE — Progress Notes (Signed)
Virtual Visit via Video Note  I connected with Raymond Nichols  on 08/28/20 at  3:40 PM EDT by a video enabled telemedicine application and verified that I am speaking with the correct person using two identifiers.  Location patient: home, [place] Location provider:work or home office Persons participating in the virtual visit: patient, provider  I discussed the limitations of evaluation and management by telemedicine and the availability of in person appointments. The patient expressed understanding and agreed to proceed.   HPI:  Acute telemedicine visit for Cough: -Onset: about 7-8 days ago -Symptoms include: sore throat, cough, sinus congestion, now feels worse and is "coughing up discolored phlegm", now worsening with sinus discomfort and thick sinus mucus -took 2 covid tests, both negative -Denies: fevers, SOB, CP, NVD, body aches -Has tried: musinex, motrin -Pertinent past medical history: allergies, chronic pansinusitis - improved with allergy shots and has not had sinus infection in several years but feels this is a sinus infection -Pertinent medication allergies: still doing allergy shots -COVID-19 vaccine status: fully vaccinated  ROS: See pertinent positives and negatives per HPI.  Past Medical History:  Diagnosis Date  . Allergic rhinitis    [facility] in [place].  Dust mites and mold--to start immunotherapy as of 12/2019.  Marland Kitchen Anal fissure 2009  . Androgenic alopecia   . Chronic pansinusitis    ENT Dr. Janace Hoard  . Family history of colon cancer in father    q 5 yr colonoscopy  . GERD (gastroesophageal reflux disease)   . History of prostatitis     Past Surgical History:  Procedure Laterality Date  . COLONOSCOPY  04/2018   benign polyp, +FH father--rpt 5 yrs Carlean Purl)     Current Outpatient Medications:  .  cetirizine (ZYRTEC) 10 MG tablet, Take 10 mg by mouth daily., Disp: , Rfl:  .  mometasone (NASONEX) 50 MCG/ACT nasal spray, Place into the nose., Disp: , Rfl:   .  amoxicillin-clavulanate (AUGMENTIN) 875-125 MG tablet, Take 1 tablet by mouth 2 (two) times daily., Disp: 20 tablet, Rfl: 0 .  benzonatate (TESSALON PERLES) 100 MG capsule, Take 1 capsule (100 mg total) by mouth 3 (three) times daily as needed., Disp: 20 capsule, Rfl: 0  Current Facility-Administered Medications:  .  0.9 %  sodium chloride infusion, 500 mL, Intravenous, Once, Gatha Mayer, MD  EXAM:  VITALS per patient if applicable:  GENERAL: alert, oriented, appears well and in no acute distress  HEENT: atraumatic, conjunttiva clear, no obvious abnormalities on inspection of external nose and ears  NECK: normal movements of the head and neck  LUNGS: on inspection no signs of respiratory distress, breathing rate appears normal, no obvious gross SOB, gasping or wheezing  CV: no obvious cyanosis  MS: moves all visible extremities without noticeable abnormality  PSYCH/NEURO: pleasant and cooperative, no obvious depression or anxiety, speech and thought processing grossly intact  ASSESSMENT AND PLAN:  Discussed the following assessment and plan:  Sinus congestion  Facial discomfort  Cough  -we discussed possible serious and likely etiologies, options for evaluation and workup, limitations of telemedicine visit vs in person visit, treatment, treatment risks and precautions. Pt prefers to treat via telemedicine empirically rather than in person at this moment. Given duration of symptoms, worsening and history he opted for possible empiric treatment with Augmentin 875 bid x 7-10 days unless improving with nasal saline over the next few days. Tessalon rx for cough.   Work/School slipped offered:declined  Advised to seek prompt in person care if  worsening, new symptoms arise, or if is not improving with treatment. Discussed options for inperson care if PCP office not available. Did let this patient know that I only do telemedicine on Tuesdays and Thursdays for [place]. Advised  to schedule follow up visit with PCP or [facility] if any further questions or concerns to avoid delays in care.   I discussed the assessment and treatment plan with the patient. The patient was provided an opportunity to ask questions and all were answered. The patient agreed with the plan and demonstrated an understanding of the instructions.     [name], DO

## 2020-08-28 NOTE — Patient Instructions (Signed)
-  I sent the medication(s) we discussed to your pharmacy: Meds ordered this encounter  Medications  . amoxicillin-clavulanate (AUGMENTIN) 875-125 MG tablet    Sig: Take 1 tablet by mouth 2 (two) times daily.    Dispense:  20 tablet    Refill:  0  . benzonatate (TESSALON PERLES) 100 MG capsule    Sig: Take 1 capsule (100 mg total) by mouth 3 (three) times daily as needed.    Dispense:  20 capsule    Refill:  0   -nasal saline twice daily  I hope you are feeling better soon!  Seek in person care promptly if your symptoms worsen, new concerns arise or you are not improving with treatment.  It was nice to meet you today. I help Raymond Nichols out with telemedicine visits on Tuesdays and Thursdays and am available for visits on those days. If you have any concerns or questions following this visit please schedule a follow up visit with your Primary Care doctor or seek care at a local urgent care clinic to avoid delays in care.

## 2020-11-03 DIAGNOSIS — E781 Pure hyperglyceridemia: Secondary | ICD-10-CM

## 2020-11-03 HISTORY — DX: Pure hyperglyceridemia: E78.1

## 2020-12-11 ENCOUNTER — Other Ambulatory Visit: Payer: Self-pay

## 2020-12-12 ENCOUNTER — Encounter: Payer: Self-pay | Admitting: Family Medicine

## 2020-12-12 ENCOUNTER — Other Ambulatory Visit: Payer: Self-pay

## 2020-12-12 ENCOUNTER — Ambulatory Visit (INDEPENDENT_AMBULATORY_CARE_PROVIDER_SITE_OTHER): Payer: 59 | Admitting: Family Medicine

## 2020-12-12 VITALS — BP 91/61 | HR 69 | Temp 97.5°F | Resp 16 | Ht 70.0 in | Wt 199.0 lb

## 2020-12-12 DIAGNOSIS — N411 Chronic prostatitis: Secondary | ICD-10-CM | POA: Diagnosis not present

## 2020-12-12 DIAGNOSIS — Z0000 Encounter for general adult medical examination without abnormal findings: Secondary | ICD-10-CM | POA: Diagnosis not present

## 2020-12-12 LAB — CBC WITH DIFFERENTIAL/PLATELET
Basophils Absolute: 0 10*3/uL (ref 0.0–0.1)
Basophils Relative: 0.6 % (ref 0.0–3.0)
Eosinophils Absolute: 0.2 10*3/uL (ref 0.0–0.7)
Eosinophils Relative: 2.9 % (ref 0.0–5.0)
HCT: 46.4 % (ref 39.0–52.0)
Hemoglobin: 16 g/dL (ref 13.0–17.0)
Lymphocytes Relative: 31.8 % (ref 12.0–46.0)
Lymphs Abs: 2 10*3/uL (ref 0.7–4.0)
MCHC: 34.4 g/dL (ref 30.0–36.0)
MCV: 89.3 fl (ref 78.0–100.0)
Monocytes Absolute: 0.6 10*3/uL (ref 0.1–1.0)
Monocytes Relative: 8.8 % (ref 3.0–12.0)
Neutro Abs: 3.6 10*3/uL (ref 1.4–7.7)
Neutrophils Relative %: 55.9 % (ref 43.0–77.0)
Platelets: 243 10*3/uL (ref 150.0–400.0)
RBC: 5.19 Mil/uL (ref 4.22–5.81)
RDW: 13.2 % (ref 11.5–15.5)
WBC: 6.4 10*3/uL (ref 4.0–10.5)

## 2020-12-12 LAB — COMPREHENSIVE METABOLIC PANEL
ALT: 44 U/L (ref 0–53)
AST: 35 U/L (ref 0–37)
Albumin: 4.7 g/dL (ref 3.5–5.2)
Alkaline Phosphatase: 43 U/L (ref 39–117)
BUN: 18 mg/dL (ref 6–23)
CO2: 29 mEq/L (ref 19–32)
Calcium: 10 mg/dL (ref 8.4–10.5)
Chloride: 104 mEq/L (ref 96–112)
Creatinine, Ser: 1.12 mg/dL (ref 0.40–1.50)
GFR: 77.52 mL/min (ref >60.00)
Glucose, Bld: 107 mg/dL — ABNORMAL HIGH (ref 70–99)
Potassium: 4.1 mEq/L (ref 3.5–5.1)
Sodium: 139 mEq/L (ref 135–145)
Total Bilirubin: 0.7 mg/dL (ref 0.2–1.2)
Total Protein: 7.2 g/dL (ref 6.0–8.3)

## 2020-12-12 LAB — LIPID PANEL
Cholesterol: 215 mg/dL — ABNORMAL HIGH (ref 0–200)
HDL: 36.6 mg/dL — ABNORMAL LOW (ref >39.00)
NonHDL: 178.89
Total CHOL/HDL Ratio: 6
Triglycerides: 298 mg/dL — ABNORMAL HIGH (ref 0.0–149.0)
VLDL: 59.6 mg/dL — ABNORMAL HIGH (ref 0.0–40.0)

## 2020-12-12 LAB — TSH: TSH: 1.35 u[IU]/mL (ref 0.35–4.50)

## 2020-12-12 LAB — LDL CHOLESTEROL, DIRECT: Direct LDL: 130 mg/dL

## 2020-12-12 MED ORDER — LEVOFLOXACIN 500 MG PO TABS: 500.0000 mg | ORAL_TABLET | Freq: Every day | ORAL | 0 refills | Status: AC

## 2020-12-12 NOTE — Patient Instructions (Signed)

## 2020-12-12 NOTE — Progress Notes (Signed)
Office Note 12/12/2020  CC:  Chief Complaint  Patient presents with  . Annual Exam    Pt is fasting    HPI:  Raymond Nichols is a 50 y.o. male who is here for annual health maintenance exam. Overall doing pretty well, exercises regularly and eats fairly healthy. Work in [employer] going fine.   Hx of recurrent prostatitis--sounds like 2-3 episodes a year. Sx's last several weeks, dull ache behind genitals, some change in urgency/frequency/hesitancy.  No burning with urination, no penile d/c.  Denies hx of STD. Abx commonly help well but it eventually returns.  He has appt with urol next month.   Past Medical History:  Diagnosis Date  . Allergic rhinitis    [facility] in [place].  Dust mites and mold--to start immunotherapy as of 12/2019.  Marland Kitchen Anal fissure 2009  . Androgenic alopecia   . Chronic pansinusitis    ENT Dr. Janace Hoard  . Family history of colon cancer in father    q 5 yr colonoscopy  . GERD (gastroesophageal reflux disease)   . History of prostatitis     Past Surgical History:  Procedure Laterality Date  . COLONOSCOPY  04/2018   benign polyp, +FH father--rpt 5 yrs Carlean Purl)    Family History  Problem Relation Age of Onset  . Cancer Mother 67       lung (smoker)  . CAD Mother 74       stent (smoker)  . Hypertension Mother   . Diabetes Mother   . Hyperlipidemia Father   . Colon cancer Father 64  . Hypertension Brother   . Diabetes Maternal Grandmother   . Alcohol abuse Maternal Grandfather   . Cancer Paternal Grandfather        pancreatic  . Stomach cancer Neg Hx     Social History   Socioeconomic History  . Marital status: Married    Spouse name: Not on file  . Number of children: 0  . Years of education: Not on file  . Highest education level: Not on file  Occupational History    Employer: [employer]  Tobacco Use  . Smoking status: Never Smoker  . Smokeless tobacco: Never Used  Vaping Use  . Vaping Use: Never  used  Substance and Sexual Activity  . Alcohol use: Yes    Comment: 2 beers once a week  . Drug use: No  . Sexual activity: Not on file  Other Topics Concern  . 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   Activity: gym 2-3 times a week, weights and cardio, running regularly    Diet: good water, some fruits/vegetables    Social Determinants of Health   Financial Resource Strain: Not on file  Food Insecurity: Not on file  Transportation Needs: Not on file  Physical Activity: Not on file  Stress: Not on file  Social Connections: Not on file  Intimate Partner Violence: Not on file    Outpatient Medications Prior to Visit  Medication Sig Dispense Refill  . metroNIDAZOLE (METROCREAM) 0.75 % cream Apply topically.    . cetirizine (ZYRTEC) 10 MG tablet Take 10 mg by mouth daily. (Patient not taking: Reported on 12/12/2020)    . amoxicillin-clavulanate (AUGMENTIN) 875-125 MG tablet Take 1 tablet by mouth 2 (two) times daily. (Patient  not taking: Reported on 12/12/2020) 20 tablet 0  . benzonatate (TESSALON PERLES) 100 MG capsule Take 1 capsule (100 mg total) by mouth 3 (three) times daily as needed. (Patient not taking: No sig reported) 20 capsule 0  . mometasone (NASONEX) 50 MCG/ACT nasal spray Place into the nose.     Facility-Administered Medications Prior to Visit  Medication Dose Route Frequency Provider Last Rate Last Admin  . 0.9 %  sodium chloride infusion  500 mL Intravenous Once Gatha Mayer, MD        Allergies  Allergen Reactions  . Molds & Smuts     ROS Review of Systems  Constitutional: Negative for appetite change, chills, fatigue and fever.  HENT: Negative for congestion, dental problem, ear pain and sore throat.   Eyes: Negative for discharge, redness and visual disturbance.  Respiratory: Negative for cough, chest  tightness, shortness of breath and wheezing.   Cardiovascular: Negative for chest pain, palpitations and leg swelling.  Gastrointestinal: Negative for abdominal pain, blood in stool, diarrhea, nausea and vomiting.  Genitourinary: Positive for urgency (see hpi). Negative for difficulty urinating, dysuria, flank pain, frequency and hematuria.  Musculoskeletal: Negative for arthralgias, back pain, joint swelling, myalgias and neck stiffness.  Skin: Negative for pallor and rash.  Neurological: Negative for dizziness, speech difficulty, weakness and headaches.  Hematological: Negative for adenopathy. Does not bruise/bleed easily.  Psychiatric/Behavioral: Negative for confusion and sleep disturbance. The patient is not nervous/anxious.    PE; Vitals with BMI 12/12/2020 08/28/2020 12/13/2019  Height 5\' 10"  - 5\' 10"   Weight 199 lbs 188 lbs 202 lbs  BMI 16.10 - 96.04  Systolic 91 - 540  Diastolic 61 - 80  Pulse 69 - 64    Gen: Alert, well appearing.  Patient is oriented to person, place, time, and situation. AFFECT: pleasant, lucid thought and speech. ENT: Ears: EACs clear, normal epithelium.  TMs with good light reflex and landmarks bilaterally.  Eyes: no injection, icteris, swelling, or exudate.  EOMI, PERRLA. Nose: no drainage or turbinate edema/swelling.  No injection or focal lesion.  Mouth: lips without lesion/swelling.  Oral mucosa pink and moist.  Dentition intact and without obvious caries or gingival swelling.  Oropharynx without erythema, exudate, or swelling.  Neck: supple/nontender.  No LAD, mass, or TM.  Carotid pulses 2+ bilaterally, without bruits. CV: RRR, no m/r/g.   LUNGS: CTA bilat, nonlabored resps, good aeration in all lung fields. ABD: soft, NT, ND, BS normal.  No hepatospenomegaly or mass.  No bruits. EXT: no clubbing, cyanosis, or edema.  Musculoskeletal: no joint swelling, erythema, warmth, or tenderness.  ROM of all joints intact. Skin - no sores or suspicious lesions or  rashes or color changes Rectal exam: negative without mass, lesions or tenderness, PROSTATE EXAM: smooth and symmetric without nodules or tenderness.   Pertinent labs:  Lab Results  Component Value Date   TSH 0.92 12/13/2019   Lab Results  Component Value Date   WBC 5.5 12/13/2019   HGB 15.1 12/13/2019   HCT 44.9 12/13/2019   MCV 89.9 12/13/2019   PLT 254.0 12/13/2019   Lab Results  Component Value Date   CREATININE 1.03 12/13/2019   BUN 14 12/13/2019   NA 139 12/13/2019   K 4.7 12/13/2019   CL 105 12/13/2019   CO2 29 12/13/2019   Lab Results  Component Value Date   ALT 17 12/13/2019   AST 15 12/13/2019   ALKPHOS 40 12/13/2019   BILITOT 0.7 12/13/2019   Lab Results  Component Value Date   CHOL 144 12/13/2019   Lab Results  Component Value Date   HDL 29.40 (L) 12/13/2019   Lab Results  Component Value Date   LDLCALC 89 12/13/2019   Lab Results  Component Value Date   TRIG 129.0 12/13/2019   Lab Results  Component Value Date   CHOLHDL 5 12/13/2019   Lab Results  Component Value Date   PSA 0.42 07/01/2016   Lab Results  Component Value Date   HGBA1C 5.4 12/13/2019   ASSESSMENT AND PLAN:   1) chronic prostatitis: discussed options in depth today. Decided to go ahead and try a month long course of levaquin 500 mg qd. Keep plan of seeing urol next month (pt says "[facility]" but he is otherwise unsure of name/specifics, says he has not seen them before).  2) Health maintenance exam: Reviewed age and gender appropriate health maintenance issues (prudent diet, regular exercise, health risks of tobacco and excessive alcohol, use of seatbelts, fire alarms in home, use of sunscreen).  Also reviewed age and gender appropriate health screening as well as vaccine recommendations. Vaccines: ALL UTD. Labs: fasting HP labs ordered. Prostate ca screening: average risk patient= as per latest guidelines, start screening at 41 yrs of age. Colon ca screening: FH CC  father.  TCS 04/2018 benign polyp.  Recall 2024-2029.  An After Visit Summary was printed and given to the patient.  FOLLOW UP:  No follow-ups on file.  Signed:  Crissie Sickles, MD           12/12/2020

## 2021-06-17 ENCOUNTER — Encounter: Payer: Self-pay | Admitting: Family Medicine

## 2021-06-17 ENCOUNTER — Ambulatory Visit (INDEPENDENT_AMBULATORY_CARE_PROVIDER_SITE_OTHER): Payer: 59 | Admitting: Family Medicine

## 2021-06-17 ENCOUNTER — Other Ambulatory Visit: Payer: Self-pay

## 2021-06-17 VITALS — BP 95/62 | HR 66 | Temp 97.4°F | Resp 16 | Ht 70.0 in | Wt 200.0 lb

## 2021-06-17 DIAGNOSIS — E781 Pure hyperglyceridemia: Secondary | ICD-10-CM | POA: Diagnosis not present

## 2021-06-17 LAB — LIPID PANEL
Cholesterol: 145 mg/dL (ref 0–200)
HDL: 34.2 mg/dL — ABNORMAL LOW (ref >39.00)
LDL Cholesterol: 78 mg/dL (ref 0–99)
NonHDL: 110.33
Total CHOL/HDL Ratio: 4
Triglycerides: 160 mg/dL — ABNORMAL HIGH (ref 0.0–149.0)
VLDL: 32 mg/dL (ref 0.0–40.0)

## 2021-06-17 NOTE — Progress Notes (Signed)
OFFICE VISIT  06/17/2021  CC:  Chief Complaint  Patient presents with   Follow-up    RCI   HPI:    Patient is a 50 y.o.male who presents for 6 mo f/u hypertriglyceridemia. A/P as of last visit: "1) chronic prostatitis: discussed options in depth today. Decided to go ahead and try a month long course of levaquin 500 mg qd. Keep plan of seeing urol next month (pt says "[facility]" but he is otherwise unsure of name/specifics, says he has not seen them before).   2) Health maintenance exam: Reviewed age and gender appropriate health maintenance issues (prudent diet, regular exercise, health risks of tobacco and excessive alcohol, use of seatbelts, fire alarms in home, use of sunscreen).  Also reviewed age and gender appropriate health screening as well as vaccine recommendations. Vaccines: ALL UTD. Labs: fasting HP labs ordered. Prostate ca screening: average risk patient= as per latest guidelines, start screening at 84 yrs of age. Colon ca screening: FH CC father.  TCS 04/2018 benign polyp.  Recall 2024-2029"  INTERIM HX: Feeling well. Last visit 6 mo ago he chose TLC for hypertriglyceridemia, wanted to hold off on meds at that time. Since then->Much better diet, much better exercise habits.  FH: mother with hyperlipidemia   Past Medical History:  Diagnosis Date   Allergic rhinitis    [facility] in [place].  Dust mites and mold--to start immunotherapy as of 12/2019.   Anal fissure 2009   Androgenic alopecia    Chronic pansinusitis    ENT Dr. Janace Hoard   Family history of colon cancer in father    q 5 yr colonoscopy   GERD (gastroesophageal reflux disease)    History of prostatitis    Hypertriglyceridemia     Past Surgical History:  Procedure Laterality Date   COLONOSCOPY  04/2018   benign polyp, +FH father--rpt 5 yrs Carlean Purl)    Outpatient Medications Prior to Visit  Medication Sig Dispense Refill   cetirizine (ZYRTEC) 10 MG tablet Take 10 mg by mouth  daily.     metroNIDAZOLE (METROCREAM) 0.75 % cream Apply topically.     tamsulosin (FLOMAX) 0.4 MG CAPS capsule Take 0.4 mg by mouth daily.     Facility-Administered Medications Prior to Visit  Medication Dose Route Frequency Provider Last Rate Last Admin   0.9 %  sodium chloride infusion  500 mL Intravenous Once Gatha Mayer, MD        Allergies  Allergen Reactions   Molds & Smuts     ROS As per HPI  PE: Vitals with BMI 06/17/2021 12/12/2020 08/28/2020  Height '5\' 10"'$  '5\' 10"'$  -  Weight 200 lbs 199 lbs 188 lbs  BMI 0000000 [id number] -  Systolic 95 91 -  Diastolic 62 61 -  Pulse 66 69 -  Gen: Alert, well appearing.  Patient is oriented to person, place, time, and situation. AFFECT: pleasant, lucid thought and speech. No further exam today.  LABS:  Lab Results  Component Value Date   TSH 1.35 12/12/2020   Lab Results  Component Value Date   WBC 6.4 12/12/2020   HGB 16.0 12/12/2020   HCT 46.4 12/12/2020   MCV 89.3 12/12/2020   PLT 243.0 12/12/2020   Lab Results  Component Value Date   CREATININE 1.12 12/12/2020   BUN 18 12/12/2020   NA 139 12/12/2020   K 4.1 12/12/2020   CL 104 12/12/2020   CO2 29 12/12/2020   Lab Results  Component Value Date  ALT 44 12/12/2020   AST 35 12/12/2020   ALKPHOS 43 12/12/2020   BILITOT 0.7 12/12/2020   Lab Results  Component Value Date   CHOL 215 (H) 12/12/2020   Lab Results  Component Value Date   HDL 36.60 (L) 12/12/2020   Lab Results  Component Value Date   LDLCALC 89 12/13/2019   Lab Results  Component Value Date   TRIG 298.0 (H) 12/12/2020   Lab Results  Component Value Date   CHOLHDL 6 12/12/2020   Lab Results  Component Value Date   PSA 0.42 07/01/2016   Lab Results  Component Value Date   HGBA1C 5.4 12/13/2019   PSA 02/2021 0.6 at [place]:  1) Hypertriglyceridemia: improved diet and exercise habits.  Wt is stable at 200 lbs (BMI 28). He understands the importance of low carb/low fat  diet and adequate cardiovascular exercise. Discussed possible need for med tx if trigs rise above 350.  An After Visit Summary was printed and given to the patient.  FOLLOW UP: Return in about 6 months (around 12/18/2021) for annual CPE (fasting).  Signed:  Crissie Sickles, MD           06/17/2021

## 2021-12-13 ENCOUNTER — Encounter: Payer: Self-pay | Admitting: Family Medicine

## 2021-12-13 ENCOUNTER — Other Ambulatory Visit: Payer: Self-pay

## 2021-12-13 ENCOUNTER — Ambulatory Visit (INDEPENDENT_AMBULATORY_CARE_PROVIDER_SITE_OTHER): Payer: 59 | Admitting: Family Medicine

## 2021-12-13 VITALS — BP 92/59 | HR 69 | Temp 98.0°F | Ht 72.0 in | Wt 215.8 lb

## 2021-12-13 DIAGNOSIS — Z125 Encounter for screening for malignant neoplasm of prostate: Secondary | ICD-10-CM | POA: Diagnosis not present

## 2021-12-13 DIAGNOSIS — E781 Pure hyperglyceridemia: Secondary | ICD-10-CM

## 2021-12-13 DIAGNOSIS — Z0000 Encounter for general adult medical examination without abnormal findings: Secondary | ICD-10-CM | POA: Diagnosis not present

## 2021-12-13 DIAGNOSIS — R7301 Impaired fasting glucose: Secondary | ICD-10-CM | POA: Diagnosis not present

## 2021-12-13 LAB — CBC WITH DIFFERENTIAL/PLATELET
Basophils Absolute: 0 10*3/uL (ref 0.0–0.1)
Basophils Relative: 0.6 % (ref 0.0–3.0)
Eosinophils Absolute: 0.2 10*3/uL (ref 0.0–0.7)
Eosinophils Relative: 2.8 % (ref 0.0–5.0)
HCT: 44.9 % (ref 39.0–52.0)
Hemoglobin: 14.9 g/dL (ref 13.0–17.0)
Lymphocytes Relative: 32.1 % (ref 12.0–46.0)
Lymphs Abs: 1.9 10*3/uL (ref 0.7–4.0)
MCHC: 33.2 g/dL (ref 30.0–36.0)
MCV: 90 fl (ref 78.0–100.0)
Monocytes Absolute: 0.5 10*3/uL (ref 0.1–1.0)
Monocytes Relative: 8.5 % (ref 3.0–12.0)
Neutro Abs: 3.3 10*3/uL (ref 1.4–7.7)
Neutrophils Relative %: 56 % (ref 43.0–77.0)
Platelets: 229 10*3/uL (ref 150.0–400.0)
RBC: 4.99 Mil/uL (ref 4.22–5.81)
RDW: 13.3 % (ref 11.5–15.5)
WBC: 5.8 10*3/uL (ref 4.0–10.5)

## 2021-12-13 LAB — COMPREHENSIVE METABOLIC PANEL
ALT: 18 U/L (ref 0–53)
AST: 14 U/L (ref 0–37)
Albumin: 4.4 g/dL (ref 3.5–5.2)
Alkaline Phosphatase: 37 U/L — ABNORMAL LOW (ref 39–117)
BUN: 19 mg/dL (ref 6–23)
CO2: 30 mEq/L (ref 19–32)
Calcium: 10 mg/dL (ref 8.4–10.5)
Chloride: 104 mEq/L (ref 96–112)
Creatinine, Ser: 1.17 mg/dL (ref 0.40–1.50)
GFR: 73.05 mL/min (ref >60.00)
Glucose, Bld: 107 mg/dL — ABNORMAL HIGH (ref 70–99)
Potassium: 4.2 mEq/L (ref 3.5–5.1)
Sodium: 142 mEq/L (ref 135–145)
Total Bilirubin: 0.8 mg/dL (ref 0.2–1.2)
Total Protein: 6.9 g/dL (ref 6.0–8.3)

## 2021-12-13 LAB — LDL CHOLESTEROL, DIRECT: Direct LDL: 121 mg/dL

## 2021-12-13 LAB — LIPID PANEL
Cholesterol: 191 mg/dL (ref 0–200)
HDL: 34.3 mg/dL — ABNORMAL LOW (ref >39.00)
NonHDL: 156.68
Total CHOL/HDL Ratio: 6
Triglycerides: 206 mg/dL — ABNORMAL HIGH (ref 0.0–149.0)
VLDL: 41.2 mg/dL — ABNORMAL HIGH (ref 0.0–40.0)

## 2021-12-13 LAB — HEMOGLOBIN A1C: Hgb A1c MFr Bld: 5.4 % (ref 4.6–6.5)

## 2021-12-13 LAB — PSA: PSA: 0.68 ng/mL (ref 0.10–4.00)

## 2021-12-13 LAB — TSH: TSH: 1.66 u[IU]/mL (ref 0.35–5.50)

## 2021-12-13 NOTE — Patient Instructions (Signed)

## 2021-12-13 NOTE — Progress Notes (Signed)
Office Note 12/13/2021  CC: cpe  HPI:  Patient is a 51 y.o. male who is here for annual health maintenance exam and f/u hypertriglyceridemia. Last labs in August 2022 showed a great response to diet and exercise--- triglycerides down to 160.  Raymond Nichols feels well. He has returned to weightlifting and doing cardio. Work is going fine.  He still occasionally deals with some prostatitis symptoms but says things are improved on tamsulosin as well as Zyrtec use. He did see urology for this.  He is on minoxidil for hair loss--gets this through his dermatologist  Past Medical History:  Diagnosis Date   Allergic rhinitis    [facility] in [place].  Dust mites and mold--to start immunotherapy as of 12/2019.   Anal fissure 2009   Androgenic alopecia    Chronic pansinusitis    ENT Dr. Janace Hoard   Family history of colon cancer in father    q 5 yr colonoscopy   GERD (gastroesophageal reflux disease)    History of prostatitis    Hypertriglyceridemia 2022   improved with TLC    Past Surgical History:  Procedure Laterality Date   COLONOSCOPY  04/2018   benign polyp, +FH father--rpt 5 yrs Carlean Purl)    Family History  Problem Relation Age of Onset   Cancer Mother 55       lung (smoker)   CAD Mother 54       stent (smoker)   Hypertension Mother    Diabetes Mother    Hyperlipidemia Father    Colon cancer Father 68   Hypertension Brother    Diabetes Maternal Grandmother    Alcohol abuse Maternal Grandfather    Cancer Paternal Grandfather        pancreatic   Stomach cancer Neg Hx     Social History   Socioeconomic History   Marital status: Married    Spouse name: Not on file   Number of children: 0   Years of education: Not on file   Highest education level: Not on file  Occupational History    Employer: [employer]  Tobacco Use   Smoking status: Never   Smokeless tobacco: Never  Vaping Use   Vaping Use: Never used  Substance and Sexual Activity    Alcohol use: Yes    Comment: 2 beers once a week   Drug use: No   Sexual activity: Not on file  Other Topics Concern   Not on file  Social History Narrative   Caffeine: 1 cup coffee   Lives with wife  no pets no children   Occupation: Freight forwarder for [employer]    Edu: college at [employer]   Activity: gym 2-3 times a week, weights and cardio, running regularly    Diet: good water, some fruits/vegetables    Social Determinants of Health   Financial Resource Strain: Not on file  Food Insecurity: Not on file  Transportation Needs: Not on file  Physical Activity: Not on file  Stress: Not on file  Social Connections: Not on file  Intimate Partner Violence: Not on file    Outpatient Medications Prior to Visit  Medication Sig Dispense Refill   cetirizine (ZYRTEC) 10 MG tablet Take 10 mg by mouth daily.     metroNIDAZOLE (METROCREAM) 0.75 % cream Apply topically.     minoxidil (LONITEN) 2.5 MG tablet Take 2.5 mg by mouth daily.     tamsulosin (FLOMAX) 0.4 MG CAPS capsule Take 0.4 mg by mouth daily.  0.9 %  sodium chloride infusion      No facility-administered medications prior to visit.    Allergies  Allergen Reactions   Molds & Smuts     ROS Review of Systems  Constitutional:  Negative for appetite change, chills, fatigue and fever.  HENT:  Negative for congestion, dental problem, ear pain and sore throat.   Eyes:  Negative for discharge, redness and visual disturbance.  Respiratory:  Negative for cough, chest tightness, shortness of breath and wheezing.   Cardiovascular:  Negative for chest pain, palpitations and leg swelling.  Gastrointestinal:  Negative for abdominal pain, blood in stool, diarrhea, nausea and vomiting.  Genitourinary:  Negative for difficulty urinating, dysuria, flank pain, frequency, hematuria and urgency.  Musculoskeletal:  Negative for arthralgias, back pain, joint swelling, myalgias and neck stiffness.  Skin:  Negative for  pallor and rash.  Neurological:  Negative for dizziness, speech difficulty, weakness and headaches.  Hematological:  Negative for adenopathy. Does not bruise/bleed easily.  Psychiatric/Behavioral:  Negative for confusion and sleep disturbance. The patient is not nervous/anxious.    PE; Vitals with BMI 12/13/2021 06/17/2021 12/12/2020  Height 6\' 0"  5\' 10"  5\' 10"   Weight 215 lbs 13 oz 200 lbs 199 lbs  BMI 29.26 82.4 23.53  Systolic 92 95 91  Diastolic 59 62 61  Pulse 69 66 69     Gen: Alert, well appearing.  Patient is oriented to person, place, time, and situation. AFFECT: pleasant, lucid thought and speech. ENT: Ears: EACs clear, normal epithelium.  TMs with good light reflex and landmarks bilaterally.  Eyes: no injection, icteris, swelling, or exudate.  EOMI, PERRLA. Nose: no drainage or turbinate edema/swelling.  No injection or focal lesion.  Mouth: lips without lesion/swelling.  Oral mucosa pink and moist.  Dentition intact and without obvious caries or gingival swelling.  Oropharynx without erythema, exudate, or swelling.  Neck: supple/nontender.  No LAD, mass, or TM.  Carotid pulses 2+ bilaterally, without bruits. CV: RRR, no m/r/g.   LUNGS: CTA bilat, nonlabored resps, good aeration in all lung fields. ABD: soft, NT, ND, BS normal.  No hepatospenomegaly or mass.  No bruits. EXT: no clubbing, cyanosis, or edema.  Musculoskeletal: no joint swelling, erythema, warmth, or tenderness.  ROM of all joints intact. Skin - no sores or suspicious lesions or rashes or color changes  Pertinent labs:  Lab Results  Component Value Date   TSH 1.35 12/12/2020   Lab Results  Component Value Date   WBC 6.4 12/12/2020   HGB 16.0 12/12/2020   HCT 46.4 12/12/2020   MCV 89.3 12/12/2020   PLT 243.0 12/12/2020   Lab Results  Component Value Date   CREATININE 1.12 12/12/2020   BUN 18 12/12/2020   NA 139 12/12/2020   K 4.1 12/12/2020   CL 104 12/12/2020   CO2 29 12/12/2020   Lab Results   Component Value Date   ALT 44 12/12/2020   AST 35 12/12/2020   ALKPHOS 43 12/12/2020   BILITOT 0.7 12/12/2020   Lab Results  Component Value Date   CHOL 145 06/17/2021   Lab Results  Component Value Date   HDL 34.20 (L) 06/17/2021   Lab Results  Component Value Date   LDLCALC 78 06/17/2021   Lab Results  Component Value Date   TRIG 160.0 (H) 06/17/2021   Lab Results  Component Value Date   CHOLHDL 4 06/17/2021   Lab Results  Component Value Date   PSA 0.42 07/01/2016   Lab Results  Component Value Date   HGBA1C 5.4 12/13/2019   ASSESSMENT AND PLAN:   Health maintenance exam: Reviewed age and gender appropriate health maintenance issues (prudent diet, regular exercise, health risks of tobacco and excessive alcohol, use of seatbelts, fire alarms in home, use of sunscreen).  Also reviewed age and gender appropriate health screening as well as vaccine recommendations. Vaccines: Flu->UTD.  Otherwise ALL UTD. Labs: fasting HP labs ordered. Prostate ca screening: PSA today Colon ca screening: [place] CC father.  TCS 04/2018 benign polyp.  Recall 2024-2029.  An After Visit Summary was printed and given to the patient.  FOLLOW UP:  Return in about 1 year (around 12/13/2022) for annual CPE (fasting).  Signed:  Crissie Sickles, MD           12/13/2021

## 2022-05-09 ENCOUNTER — Telehealth: Payer: Self-pay

## 2022-05-09 MED ORDER — CETIRIZINE HCL 10 MG PO TABS: 10.0000 mg | ORAL_TABLET | Freq: Every day | ORAL | 3 refills | Status: DC

## 2022-05-09 MED ORDER — TAMSULOSIN HCL 0.4 MG PO CAPS: 0.4000 mg | ORAL_CAPSULE | Freq: Every day | ORAL | 3 refills | Status: AC

## 2022-05-09 NOTE — Telephone Encounter (Signed)
Patient seen Dr. Anitra Lauth for CPE on 12/13/21.  He is aware of medications patient is taking.  Patient states his urologist at [facility] has left practice.  Patient is requesting Dr. Anitra Lauth take over of prescription management of 2 prescriptions that previous provider prescribed. Patient aware Dr. Anitra Lauth out of office today, possible return on Monday 7/10.   [facility] is preferred pharmacy location  Patient can be reached at [phone].  tamsulosin (FLOMAX) 0.4 MG CAPS capsule [[id number]]   cetirizine (ZYRTEC) 10 MG tablet [[id number]]

## 2022-05-09 NOTE — Telephone Encounter (Signed)
Please advise if okay?

## 2022-05-09 NOTE — Telephone Encounter (Signed)
Tamsulosin and cetirizine eRx'd

## 2022-05-12 NOTE — Telephone Encounter (Signed)
Patient wife (DPR) called regarding refill request. She is aware prescriptions were sent for 90 d/s with 3 refills on Friday 7/7 by Dr. Anitra Lauth. Closed encounter.  tamsulosin (FLOMAX) 0.4 MG CAPS capsule [[id number]]  cetirizine (ZYRTEC) 10 MG tablet [[id number]]

## 2022-05-12 NOTE — Telephone Encounter (Signed)
Noted, thanks!

## 2022-07-28 ENCOUNTER — Telehealth (INDEPENDENT_AMBULATORY_CARE_PROVIDER_SITE_OTHER): Payer: 59 | Admitting: Family Medicine

## 2022-07-28 ENCOUNTER — Encounter: Payer: Self-pay | Admitting: Family Medicine

## 2022-07-28 DIAGNOSIS — J988 Other specified respiratory disorders: Secondary | ICD-10-CM

## 2022-07-28 DIAGNOSIS — U071 COVID-19: Secondary | ICD-10-CM | POA: Diagnosis not present

## 2022-07-28 MED ORDER — NIRMATRELVIR/RITONAVIR (PAXLOVID)TABLET: 3.0000 | ORAL_TABLET | Freq: Two times a day (BID) | ORAL | 0 refills | Status: AC

## 2022-07-28 NOTE — Progress Notes (Signed)
Virtual Visit via Video Note  I connected with Raymond Nichols  on 07/28/22 at  9:40 AM EDT by a video enabled telemedicine application and verified that I am speaking with the correct person using two identifiers.  Location patient: [place] Location provider:work or home office Persons participating in the virtual visit: patient, provider  I discussed the limitations and requested verbal permission for telemedicine visit. The patient expressed understanding and agreed to proceed.  CC: "covid positive"  HPI: 51 year old male being seen today for 3-day history of nasal congestion, runny nose, postnasal drip, and some cough.  No fever, no shortness of breath, no chest pain. Eating and drinking well. He has taken some Tylenol for sinus pressure. He took a home COVID test today and it was positive.  ROS: See pertinent positives and negatives per HPI.  Past Medical History:  Diagnosis Date   Allergic rhinitis    [facility] in [place].  Dust mites and mold--to start immunotherapy as of 12/2019.   Anal fissure 2009   Androgenic alopecia    Minoxidil per dermatologist   Chronic pansinusitis    ENT Dr. Janace Hoard   Family history of colon cancer in father    q 5 yr colonoscopy   GERD (gastroesophageal reflux disease)    History of prostatitis    Has seen urology.  Flomax and cetirizine helpful.   Hypertriglyceridemia 2022   improved with TLC    Past Surgical History:  Procedure Laterality Date   COLONOSCOPY  04/2018   benign polyp, +FH father--rpt 5 yrs Carlean Purl)     Current Outpatient Medications:    cetirizine (ZYRTEC) 10 MG tablet, Take 1 tablet (10 mg total) by mouth daily., Disp: 90 tablet, Rfl: 3   metroNIDAZOLE (METROCREAM) 0.75 % cream, Apply topically., Disp: , Rfl:    minoxidil (LONITEN) 2.5 MG tablet, Take 2.5 mg by mouth daily., Disp: , Rfl:    tamsulosin (FLOMAX) 0.4 MG CAPS capsule, Take 1 capsule (0.4 mg total) by mouth daily., Disp: 90 capsule, Rfl:  3  EXAM:  VITALS per patient if applicable:     5/73/2202    8:51 AM 06/17/2021    8:18 AM 12/12/2020    9:02 AM  Vitals with BMI  Height '6\' 0"'$  '5\' 10"'$  '5\' 10"'$   Weight 215 lbs 13 oz 200 lbs 199 lbs  BMI 29.26 54.2 70.62  Systolic 92 95 91  Diastolic 59 62 61  Pulse 69 66 69     GENERAL: alert, oriented, appears well and in no acute distress  HEENT: atraumatic, conjunttiva clear, no obvious abnormalities on inspection of external nose and ears  NECK: normal movements of the head and neck  LUNGS: on inspection no signs of respiratory distress, breathing rate appears normal, no obvious gross SOB, gasping or wheezing  CV: no obvious cyanosis  MS: moves all visible extremities without noticeable abnormality  PSYCH/NEURO: pleasant and cooperative, no obvious depression or anxiety, speech and thought processing grossly intact  LABS: none today    Chemistry      Component Value Date/Time   NA 142 12/13/2021 0913   K 4.2 12/13/2021 0913   CL 104 12/13/2021 0913   CO2 30 12/13/2021 0913   BUN 19 12/13/2021 0913   CREATININE 1.17 12/13/2021 0913      Component Value Date/Time   CALCIUM 10.0 12/13/2021 0913   ALKPHOS 37 (L) 12/13/2021 0913   AST 14 12/13/2021 0913   ALT 18 12/13/2021 0913   BILITOT 0.8 12/13/2021 0913  ASSESSMENT AND PLAN:  Discussed the following assessment and plan:  COVID-19 respiratory illness.  Mild symptoms. His risk factor for complications from COVID is overweight -->BMI 29. GFR 73 in Feb this year. Paxlovid x5 days prescribed. We will continue over-the-counter symptomatic medication as appropriate.  I discussed the assessment and treatment plan with the patient. The patient was provided an opportunity to ask questions and all were answered. The patient agreed with the plan and demonstrated an understanding of the instructions.   F/u: if not signif imp in 4-5d  Signed:  Crissie Sickles, MD           07/28/2022

## 2022-12-15 ENCOUNTER — Other Ambulatory Visit: Payer: Self-pay | Admitting: Family Medicine

## 2022-12-16 ENCOUNTER — Encounter: Payer: 59 | Admitting: Family Medicine

## 2022-12-24 NOTE — Patient Instructions (Signed)
Health Maintenance, Male Adopting a healthy lifestyle and getting preventive care are important in promoting health and wellness. Ask your health care provider about: The right schedule for you to have regular tests and exams. Things you can do on your own to prevent diseases and keep yourself healthy. What should I know about diet, weight, and exercise? Eat a healthy diet  Eat a diet that includes plenty of vegetables, fruits, low-fat dairy products, and lean protein. Do not eat a lot of foods that are high in solid fats, added sugars, or sodium. Maintain a healthy weight Body mass index (BMI) is a measurement that can be used to identify possible weight problems. It estimates body fat based on height and weight. Your health care provider can help determine your BMI and help you achieve or maintain a healthy weight. Get regular exercise Get regular exercise. This is one of the most important things you can do for your health. Most adults should: Exercise for at least 150 minutes each week. The exercise should increase your heart rate and make you sweat (moderate-intensity exercise). Do strengthening exercises at least twice a week. This is in addition to the moderate-intensity exercise. Spend less time sitting. Even light physical activity can be beneficial. Watch cholesterol and blood lipids Have your blood tested for lipids and cholesterol at 51 years of age, then have this test every 5 years. You may need to have your cholesterol levels checked more often if: Your lipid or cholesterol levels are high. You are older than 51 years of age. You are at high risk for heart disease. What should I know about cancer screening? Many types of cancers can be detected early and may often be prevented. Depending on your health history and family history, you may need to have cancer screening at various ages. This may include screening for: Colorectal cancer. Prostate cancer. Skin cancer. Lung  cancer. What should I know about heart disease, diabetes, and high blood pressure? Blood pressure and heart disease High blood pressure causes heart disease and increases the risk of stroke. This is more likely to develop in people who have high blood pressure readings or are overweight. Talk with your health care provider about your target blood pressure readings. Have your blood pressure checked: Every 3-5 years if you are 18-39 years of age. Every year if you are 40 years old or older. If you are between the ages of 65 and 75 and are a current or former smoker, ask your health care provider if you should have a one-time screening for abdominal aortic aneurysm (AAA). Diabetes Have regular diabetes screenings. This checks your fasting blood sugar level. Have the screening done: Once every three years after age 45 if you are at a normal weight and have a low risk for diabetes. More often and at a younger age if you are overweight or have a high risk for diabetes. What should I know about preventing infection? Hepatitis B If you have a higher risk for hepatitis B, you should be screened for this virus. Talk with your health care provider to find out if you are at risk for hepatitis B infection. Hepatitis C Blood testing is recommended for: Everyone born from 1945 through 1965. Anyone with known risk factors for hepatitis C. Sexually transmitted infections (STIs) You should be screened each year for STIs, including gonorrhea and chlamydia, if: You are sexually active and are younger than 51 years of age. You are older than 51 years of age and your   health care provider tells you that you are at risk for this type of infection. Your sexual activity has changed since you were last screened, and you are at increased risk for chlamydia or gonorrhea. Ask your health care provider if you are at risk. Ask your health care provider about whether you are at high risk for HIV. Your health care provider  may recommend a prescription medicine to help prevent HIV infection. If you choose to take medicine to prevent HIV, you should first get tested for HIV. You should then be tested every 3 months for as long as you are taking the medicine. Follow these instructions at home: Alcohol use Do not drink alcohol if your health care provider tells you not to drink. If you drink alcohol: Limit how much you have to 0-2 drinks a day. Know how much alcohol is in your drink. In the [place] one drink equals one 12 oz bottle of beer (355 mL), one 5 oz glass of wine (148 mL), or one 1 oz glass of hard liquor (44 mL). Lifestyle Do not use any products that contain nicotine or tobacco. These products include cigarettes, chewing tobacco, and vaping devices, such as e-cigarettes. If you need help quitting, ask your health care provider. Do not use street drugs. Do not share needles. Ask your health care provider for help if you need support or information about quitting drugs. General instructions Schedule regular health, dental, and eye exams. Stay current with your vaccines. Tell your health care provider if: You often feel depressed. You have ever been abused or do not feel safe at home. Summary Adopting a healthy lifestyle and getting preventive care are important in promoting health and wellness. Follow your health care provider's instructions about healthy diet, exercising, and getting tested or screened for diseases. Follow your health care provider's instructions on monitoring your cholesterol and blood pressure. This information is not intended to replace advice given to you by your health care provider. Make sure you discuss any questions you have with your health care provider. Document Revised: 03/11/2021 Document Reviewed: 03/11/2021 Elsevier Patient Education  2023 [employer]  

## 2022-12-26 ENCOUNTER — Ambulatory Visit (INDEPENDENT_AMBULATORY_CARE_PROVIDER_SITE_OTHER): Payer: 59 | Admitting: Family Medicine

## 2022-12-26 ENCOUNTER — Encounter: Payer: Self-pay | Admitting: Family Medicine

## 2022-12-26 VITALS — BP 101/67 | HR 73 | Temp 98.0°F | Ht 72.0 in | Wt 224.4 lb

## 2022-12-26 DIAGNOSIS — Z125 Encounter for screening for malignant neoplasm of prostate: Secondary | ICD-10-CM

## 2022-12-26 DIAGNOSIS — Z0000 Encounter for general adult medical examination without abnormal findings: Secondary | ICD-10-CM | POA: Diagnosis not present

## 2022-12-26 DIAGNOSIS — J309 Allergic rhinitis, unspecified: Secondary | ICD-10-CM | POA: Diagnosis not present

## 2022-12-26 LAB — CBC
HCT: 46 % (ref 39.0–52.0)
Hemoglobin: 15.7 g/dL (ref 13.0–17.0)
MCHC: 34.1 g/dL (ref 30.0–36.0)
MCV: 88.6 fl (ref 78.0–100.0)
Platelets: 257 10*3/uL (ref 150.0–400.0)
RBC: 5.2 Mil/uL (ref 4.22–5.81)
RDW: 13.2 % (ref 11.5–15.5)
WBC: 5.7 10*3/uL (ref 4.0–10.5)

## 2022-12-26 LAB — COMPREHENSIVE METABOLIC PANEL
ALT: 30 U/L (ref 0–53)
AST: 19 U/L (ref 0–37)
Albumin: 4.7 g/dL (ref 3.5–5.2)
Alkaline Phosphatase: 56 U/L (ref 39–117)
BUN: 12 mg/dL (ref 6–23)
CO2: 27 mEq/L (ref 19–32)
Calcium: 10 mg/dL (ref 8.4–10.5)
Chloride: 103 mEq/L (ref 96–112)
Creatinine, Ser: 1.05 mg/dL (ref 0.40–1.50)
GFR: 82.58 mL/min (ref >60.00)
Glucose, Bld: 97 mg/dL (ref 70–99)
Potassium: 4.3 mEq/L (ref 3.5–5.1)
Sodium: 139 mEq/L (ref 135–145)
Total Bilirubin: 0.8 mg/dL (ref 0.2–1.2)
Total Protein: 7.1 g/dL (ref 6.0–8.3)

## 2022-12-26 LAB — LIPID PANEL
Cholesterol: 216 mg/dL — ABNORMAL HIGH (ref 0–200)
HDL: 35.5 mg/dL — ABNORMAL LOW (ref >39.00)
NonHDL: 180
Total CHOL/HDL Ratio: 6
Triglycerides: 272 mg/dL — ABNORMAL HIGH (ref 0.0–149.0)
VLDL: 54.4 mg/dL — ABNORMAL HIGH (ref 0.0–40.0)

## 2022-12-26 LAB — TSH: TSH: 1.67 u[IU]/mL (ref 0.35–5.50)

## 2022-12-26 LAB — LDL CHOLESTEROL, DIRECT: Direct LDL: 135 mg/dL

## 2022-12-26 LAB — PSA: PSA: 0.69 ng/mL (ref 0.10–4.00)

## 2022-12-26 MED ORDER — MONTELUKAST SODIUM 10 MG PO TABS: 10.0000 mg | ORAL_TABLET | Freq: Every day | ORAL | 3 refills | Status: DC

## 2022-12-26 NOTE — Progress Notes (Signed)
Office Note 12/26/2022  CC:  Chief Complaint  Patient presents with   Annual Exam    Pt is fasting   HPI:  Patient is a 52 y.o. male who is here for annual health maintenance exam. Feeling well except some chronic nasal congestion that gets worse at night.  Has been to an allergist, has not had much good effect from antihistamines.  He is working out regularly for the last year or so and feels good. Diet is fair. He recently took a vacation to [place].  Past Medical History:  Diagnosis Date   Allergic rhinitis    [facility] in [place].  Dust mites and mold--to start immunotherapy as of 12/2019.   Anal fissure 2009   Androgenic alopecia    Minoxidil per dermatologist   Chronic pansinusitis    ENT Dr. Janace Hoard   Family history of colon cancer in father    q 5 yr colonoscopy   GERD (gastroesophageal reflux disease)    History of prostatitis    Has seen urology.  Flomax and cetirizine helpful.   Hypertriglyceridemia 2022   improved with TLC   Microscopic hematuria    2023-->imaging showed small stone + small R renal mass-->urol doing surveillance imaging    Past Surgical History:  Procedure Laterality Date   COLONOSCOPY  04/2018   benign polyp, +FH father--rpt 5 yrs Carlean Purl)    Family History  Problem Relation Age of Onset   Cancer Mother 66       lung (smoker)   CAD Mother 38       stent (smoker)   Hypertension Mother    Diabetes Mother    Hyperlipidemia Father    Colon cancer Father 17   Hypertension Brother    Diabetes Maternal Grandmother    Alcohol abuse Maternal Grandfather    Cancer Paternal Grandfather        pancreatic   Stomach cancer Neg Hx     Social History   Socioeconomic History   Marital status: Married    Spouse name: Not on file   Number of children: 0   Years of education: Not on file   Highest education level: Not on file  Occupational History    Employer: [employer]  Tobacco Use   Smoking status: Never    Smokeless tobacco: Never  Vaping Use   Vaping Use: Never used  Substance and Sexual Activity   Alcohol use: Yes    Comment: 2 beers once a week   Drug use: No   Sexual activity: Not on file  Other Topics Concern   Not on file  Social History Narrative   Caffeine: 1 cup coffee   Lives with wife  no pets no children   Occupation: Freight forwarder for [employer]    Edu: college at [employer] T/A/Ds.   Activity: gym 2-3 times a week, weights and cardio, running regularly    Diet: good water, some fruits/vegetables    Social Determinants of Health   Financial Resource Strain: Not on file  Food Insecurity: Not on file  Transportation Needs: Not on file  Physical Activity: Not on file  Stress: Not on file  Social Connections: Not on file  Intimate Partner Violence: Not on file    Outpatient Medications Prior to Visit  Medication Sig Dispense Refill   cetirizine (ZYRTEC) 10 MG tablet Take 1 tablet (10 mg total) by mouth daily. 90 tablet 3   metroNIDAZOLE (METROCREAM) 0.75 % cream Apply topically.  minoxidil (LONITEN) 2.5 MG tablet Take 2.5 mg by mouth daily.     tadalafil (CIALIS) 5 MG tablet Take 1 tablet by mouth daily.     tamsulosin (FLOMAX) 0.4 MG CAPS capsule Take 1 capsule (0.4 mg total) by mouth daily. 90 capsule 3   No facility-administered medications prior to visit.    Allergies  Allergen Reactions   Molds & Smuts     Review of Systems  Constitutional:  Negative for appetite change, chills, fatigue and fever.  HENT:  Positive for congestion. Negative for dental problem, ear pain and sore throat.   Eyes:  Negative for discharge, redness and visual disturbance.  Respiratory:  Negative for cough, chest tightness, shortness of breath and wheezing.   Cardiovascular:  Negative for chest pain, palpitations and leg swelling.  Gastrointestinal:  Negative for abdominal pain, blood in stool, diarrhea, nausea and vomiting.  Genitourinary:  Negative for difficulty  urinating, dysuria, flank pain, frequency, hematuria and urgency.  Musculoskeletal:  Negative for arthralgias, back pain, joint swelling, myalgias and neck stiffness.  Skin:  Negative for pallor and rash.  Neurological:  Negative for dizziness, speech difficulty, weakness and headaches.  Hematological:  Negative for adenopathy. Does not bruise/bleed easily.  Psychiatric/Behavioral:  Negative for confusion and sleep disturbance. The patient is not nervous/anxious.     PE;    12/26/2022    8:56 AM 12/13/2021    8:51 AM 06/17/2021    8:18 AM  Vitals with BMI  Height '6\' 0"'$  '6\' 0"'$  '5\' 10"'$   Weight 224 lbs 6 oz 215 lbs 13 oz 200 lbs  BMI 30.43 [id number]  Systolic [id number] 92 95  Diastolic 67 59 62  Pulse 73 69 66   Gen: Alert, well appearing.  Patient is oriented to person, place, time, and situation. AFFECT: pleasant, lucid thought and speech. ENT: Ears: EACs clear, normal epithelium.  TMs with good light reflex and landmarks bilaterally.  Eyes: no injection, icteris, swelling, or exudate.  EOMI, PERRLA. Nose: no drainage or turbinate edema/swelling.  No injection or focal lesion.  Mouth: lips without lesion/swelling.  Oral mucosa pink and moist.  Dentition intact and without obvious caries or gingival swelling.  Oropharynx without erythema, exudate, or swelling.  Neck: supple/nontender.  No LAD, mass, or TM.  Carotid pulses 2+ bilaterally, without bruits. CV: RRR, no m/r/g.   LUNGS: CTA bilat, nonlabored resps, good aeration in all lung fields. ABD: soft, NT, ND, BS normal.  No hepatospenomegaly or mass.  No bruits. EXT: no clubbing, cyanosis, or edema.  Musculoskeletal: no joint swelling, erythema, warmth, or tenderness.  ROM of all joints intact. Skin - no sores or suspicious lesions or rashes or color changes  Pertinent labs:  Lab Results  Component Value Date   TSH 1.66 12/13/2021   Lab Results  Component Value Date   WBC 5.8 12/13/2021   HGB 14.9 12/13/2021   HCT 44.9 12/13/2021    MCV 90.0 12/13/2021   PLT 229.0 12/13/2021   Lab Results  Component Value Date   CREATININE 1.17 12/13/2021   BUN 19 12/13/2021   NA 142 12/13/2021   K 4.2 12/13/2021   CL 104 12/13/2021   CO2 30 12/13/2021   Lab Results  Component Value Date   ALT 18 12/13/2021   AST 14 12/13/2021   ALKPHOS 37 (L) 12/13/2021   BILITOT 0.8 12/13/2021   Lab Results  Component Value Date   CHOL 191 12/13/2021   Lab Results  Component Value Date  HDL 34.30 (L) 12/13/2021   Lab Results  Component Value Date   LDLCALC 78 06/17/2021   Lab Results  Component Value Date   TRIG 206.0 (H) 12/13/2021   Lab Results  Component Value Date   CHOLHDL 6 12/13/2021   Lab Results  Component Value Date   PSA 0.68 12/13/2021   PSA 0.42 07/01/2016   Lab Results  Component Value Date   HGBA1C 5.4 12/13/2021   ASSESSMENT AND PLAN:   #1 health maintenance exam: Reviewed age and gender appropriate health maintenance issues (prudent diet, regular exercise, health risks of tobacco and excessive alcohol, use of seatbelts, fire alarms in home, use of sunscreen).  Also reviewed age and gender appropriate health screening as well as vaccine recommendations. Vaccines: Shingrix->he declined.   Otherwise ALL UTD. Labs: fasting HP labs ordered. Prostate ca screening: PSA today Colon ca screening: D[facility] father.  TCS 04/2018 benign polyp.  Recall 2024-2029.  #2 allergic rhinitis. Will do trial of Singulair 10 mg daily. He can continue nonsedating antihistamine. He can also try over-the-counter nasal steroid.  An After Visit Summary was printed and given to the patient.  FOLLOW UP:  Return in about 1 year (around 12/27/2023) for annual CPE (fasting).  Signed:  Crissie Sickles, MD           12/26/2022

## 2023-03-23 ENCOUNTER — Other Ambulatory Visit: Payer: Self-pay | Admitting: Family Medicine

## 2023-03-31 ENCOUNTER — Ambulatory Visit (INDEPENDENT_AMBULATORY_CARE_PROVIDER_SITE_OTHER): Payer: 59 | Admitting: Family Medicine

## 2023-03-31 VITALS — BP 114/72 | HR 75 | Temp 98.7°F | Ht 71.0 in | Wt 214.5 lb

## 2023-03-31 DIAGNOSIS — R051 Acute cough: Secondary | ICD-10-CM | POA: Diagnosis not present

## 2023-03-31 MED ORDER — PREDNISONE 10 MG PO TABS: ORAL_TABLET | ORAL | 0 refills | Status: AC

## 2023-03-31 NOTE — Progress Notes (Signed)
   Acute Office Visit   Subjective:  Patient ID: Raymond Nichols, male    DOB: 1971/11/01, 52 y.o.   MRN: [id number]  Chief Complaint  Patient presents with   Cough    Pt reports he has had cough for 2-3 weeks. Pt has not been seen OTC Robitussin   HPI: Patient is a 52 year old male that presents with a dry cough for the last 2-3 weeks. Started out with a sore throat, sinus symptoms, and fever at the first of the month. Those symptoms resolved, except the cough. Denies wheezing, chest pain. Occasional SHOB for about 30 minutes over the weekend.   Negative covid home test at the beginning of the month.  Been taking OTC Robitussin with little relief and Cetirizine 10mg  daily.   Review of Systems  Respiratory:  Positive for cough.   See HPI above      Objective:   BP 114/72   Pulse 75   Temp 98.7 F (37.1 C)   Ht 5\' 11"  (1.803 m)   Wt 214 lb 8 oz (97.3 kg)   SpO2 98%   BMI 29.92 kg/m    Physical Exam Vitals reviewed.  Constitutional:      General: He is not in acute distress.    Appearance: Normal appearance. He is not ill-appearing, toxic-appearing or diaphoretic.  HENT:     Head: Normocephalic and atraumatic.     Mouth/Throat:     Mouth: Mucous membranes are moist.     Pharynx: No oropharyngeal exudate or posterior oropharyngeal erythema.  Eyes:     General:        Right eye: No discharge.        Left eye: No discharge.     Conjunctiva/sclera: Conjunctivae normal.  Cardiovascular:     Rate and Rhythm: Normal rate and regular rhythm.     Heart sounds: Normal heart sounds. No murmur heard.    No friction rub. No gallop.  Pulmonary:     Effort: Pulmonary effort is normal. No respiratory distress.     Breath sounds: Normal breath sounds.     Comments: Dry cough consistently during visit.  Musculoskeletal:        General: Normal range of motion.  Skin:    General: Skin is warm and dry.  Neurological:     General: No focal deficit present.     Mental Status: He  is alert and oriented to person, place, and time. Mental status is at baseline.  Psychiatric:        Mood and Affect: Mood normal.        Behavior: Behavior normal.        Thought Content: Thought content normal.        Judgment: Judgment normal.       Assessment & Plan:  Acute cough -     predniSONE; Take 6 tablets (60 mg total) by mouth daily with breakfast for 1 day, THEN 5 tablets (50 mg total) daily with breakfast for 1 day, THEN 4 tablets (40 mg total) daily with breakfast for 1 day, THEN 3 tablets (30 mg total) daily with breakfast for 1 day, THEN 2 tablets (20 mg total) daily with breakfast for 1 day, THEN 1 tablet (10 mg total) daily with breakfast for 1 day.  Dispense: 21 tablet; Refill: 0  -START Prednisone 10mg  tablet, 6 day taper.  -Follow up if not improved or becomes worse with PCP or at this location.   Zandra Abts, NP

## 2023-03-31 NOTE — Patient Instructions (Signed)
-  START Prednisone 10mg  tablet,  6 day taper.  -Follow up if not improved or becomes worse with PCP or at this location.

## 2023-04-16 ENCOUNTER — Other Ambulatory Visit: Payer: Self-pay | Admitting: Family Medicine

## 2023-12-31 NOTE — Patient Instructions (Signed)

## 2024-01-01 ENCOUNTER — Ambulatory Visit: Payer: 59 | Admitting: Family Medicine

## 2024-01-01 ENCOUNTER — Encounter: Payer: Self-pay | Admitting: Family Medicine

## 2024-01-01 VITALS — BP 112/77 | HR 74 | Ht 71.0 in | Wt 216.6 lb

## 2024-01-01 DIAGNOSIS — E782 Mixed hyperlipidemia: Secondary | ICD-10-CM

## 2024-01-01 DIAGNOSIS — Z0000 Encounter for general adult medical examination without abnormal findings: Secondary | ICD-10-CM | POA: Diagnosis not present

## 2024-01-01 DIAGNOSIS — Z23 Encounter for immunization: Secondary | ICD-10-CM | POA: Diagnosis not present

## 2024-01-01 DIAGNOSIS — R3129 Other microscopic hematuria: Secondary | ICD-10-CM | POA: Diagnosis not present

## 2024-01-01 DIAGNOSIS — Z125 Encounter for screening for malignant neoplasm of prostate: Secondary | ICD-10-CM

## 2024-01-01 LAB — POCT URINALYSIS DIPSTICK
Bilirubin, UA: NEGATIVE
Blood, UA: POSITIVE
Glucose, UA: NEGATIVE
Ketones, UA: NEGATIVE
Leukocytes, UA: NEGATIVE
Nitrite, UA: NEGATIVE
Protein, UA: NEGATIVE
Spec Grav, UA: 1.01 (ref 1.010–1.025)
Urobilinogen, UA: 0.2 U/dL
pH, UA: 6 (ref 5.0–8.0)

## 2024-01-01 LAB — CBC WITH DIFFERENTIAL/PLATELET
Basophils Absolute: 0.1 10*3/uL (ref 0.0–0.1)
Basophils Relative: 1.1 % (ref 0.0–3.0)
Eosinophils Absolute: 0.1 10*3/uL (ref 0.0–0.7)
Eosinophils Relative: 2 % (ref 0.0–5.0)
HCT: 47.4 % (ref 39.0–52.0)
Hemoglobin: 15.9 g/dL (ref 13.0–17.0)
Lymphocytes Relative: 26.1 % (ref 12.0–46.0)
Lymphs Abs: 1.9 10*3/uL (ref 0.7–4.0)
MCHC: 33.5 g/dL (ref 30.0–36.0)
MCV: 91.3 fL (ref 78.0–100.0)
Monocytes Absolute: 0.4 10*3/uL (ref 0.1–1.0)
Monocytes Relative: 5.6 % (ref 3.0–12.0)
Neutro Abs: 4.8 10*3/uL (ref 1.4–7.7)
Neutrophils Relative %: 65.2 % (ref 43.0–77.0)
Platelets: 283 10*3/uL (ref 150.0–400.0)
RBC: 5.19 Mil/uL (ref 4.22–5.81)
RDW: 13.1 % (ref 11.5–15.5)
WBC: 7.4 10*3/uL (ref 4.0–10.5)

## 2024-01-01 LAB — COMPREHENSIVE METABOLIC PANEL
ALT: 13 U/L (ref 0–53)
AST: 15 U/L (ref 0–37)
Albumin: 4.9 g/dL (ref 3.5–5.2)
Alkaline Phosphatase: 46 U/L (ref 39–117)
BUN: 14 mg/dL (ref 6–23)
CO2: 27 meq/L (ref 19–32)
Calcium: 9.7 mg/dL (ref 8.4–10.5)
Chloride: 103 meq/L (ref 96–112)
Creatinine, Ser: 1.06 mg/dL (ref 0.40–1.50)
GFR: 81.06 mL/min (ref >60.00)
Glucose, Bld: 92 mg/dL (ref 70–99)
Potassium: 4.5 meq/L (ref 3.5–5.1)
Sodium: 139 meq/L (ref 135–145)
Total Bilirubin: 0.8 mg/dL (ref 0.2–1.2)
Total Protein: 7.2 g/dL (ref 6.0–8.3)

## 2024-01-01 LAB — LIPID PANEL
Cholesterol: 164 mg/dL (ref 0–200)
HDL: 33.5 mg/dL — ABNORMAL LOW (ref >39.00)
LDL Cholesterol: 101 mg/dL — ABNORMAL HIGH (ref 0–99)
NonHDL: 130.74
Total CHOL/HDL Ratio: 5
Triglycerides: 148 mg/dL (ref 0.0–149.0)
VLDL: 29.6 mg/dL (ref 0.0–40.0)

## 2024-01-01 LAB — PSA: PSA: 0.54 ng/mL (ref 0.10–4.00)

## 2024-01-01 LAB — TSH: TSH: 1.69 u[IU]/mL (ref 0.35–5.50)

## 2024-01-01 NOTE — Progress Notes (Signed)
 Office Note 01/01/2024  CC:  Chief Complaint  Patient presents with   Annual Exam    Pt is fasting    HPI:  Patient is a 53 y.o. male who is here for annual health maintenance exam. Raymond Nichols is feeling well.  Surveillance imaging for his right renal mass has shown stability. He has had microhematuria and would like to check to see if this is present again today. He has no episodes of gross hematuria.  Past Medical History:  Diagnosis Date   Allergic rhinitis    [facility] in [place].  Dust mites and mold--to start immunotherapy as of 12/2019.   Anal fissure 2009   Androgenic alopecia    Minoxidil per dermatologist   Chronic pansinusitis    ENT Dr. Jearld Fenton   Family history of colon cancer in father    q 5 yr colonoscopy   GERD (gastroesophageal reflux disease)    History of prostatitis    Has seen urology.  Flomax and cetirizine helpful.   Hypertriglyceridemia 2022   improved with TLC   Microscopic hematuria    2023-->imaging showed small stone + small R renal mass-->urol doing surveillance imaging    Past Surgical History:  Procedure Laterality Date   COLONOSCOPY  04/2018   benign polyp, +FH father--rpt 5 yrs Leone Payor)    Family History  Problem Relation Age of Onset   Cancer Mother 43       lung (smoker)   CAD Mother 63       stent (smoker)   Hypertension Mother    Diabetes Mother    Hyperlipidemia Father    Colon cancer Father 102   Hypertension Brother    Diabetes Maternal Grandmother    Alcohol abuse Maternal Grandfather    Cancer Paternal Grandfather        pancreatic   Stomach cancer Neg Hx     Social History   Socioeconomic History   Marital status: Married    Spouse name: Not on file   Number of children: 0   Years of education: Not on file   Highest education level: Bachelor's degree (e.g., BA, AB, BS)  Occupational History    Employer: [employer]  Tobacco Use   Smoking status: Never   Smokeless tobacco: Never   Vaping Use   Vaping status: Never Used  Substance and Sexual Activity   Alcohol use: Yes    Comment: 2 beers once a week   Drug use: No   Sexual activity: Not on file  Other Topics Concern   Not on file  Social History Narrative   Caffeine: 1 cup coffee   Lives with wife  no pets no children   Occupation: Production designer, theatre/television/film for [employer]    Edu: college at [employer].   NO T/A/Ds.   Activity: gym 2-3 times a week, weights and cardio, running regularly    Diet: good water, some fruits/vegetables    Social Drivers of Health   Financial Resource Strain: Low Risk  (03/31/2023)   Overall Financial Resource Strain (CARDIA)    Difficulty of Paying Living Expenses: Not hard at all  Food Insecurity: No Food Insecurity (03/31/2023)   Hunger Vital Sign    Worried About Running Out of Food in the Last Year: Never true    Ran Out of Food in the Last Year: Never true  Transportation Needs: Unknown (03/31/2023)   PRAPARE - Administrator, Civil Service (Medical): No    Lack of Transportation (Non-Medical):  Not on file  Physical Activity: Insufficiently Active (03/31/2023)   Exercise Vital Sign    Days of Exercise per Week: 3 days    Minutes of Exercise per Session: 30 min  Stress: No Stress Concern Present (03/31/2023)   [employer] of Occupational Health - Occupational Stress Questionnaire    Feeling of Stress : Only a little  Social Connections: Unknown (04/27/2023)   Received from [facility]   Social Network    Social Network: Not on file  Recent Concern: Social Connections - Socially Isolated (03/31/2023)   Social Connection and Isolation Panel [NHANES]    Frequency of Communication with Friends and Family: Never    Frequency of Social Gatherings with Friends and Family: Once a week    Attends Religious Services: Never    Database administrator or Organizations: No    Attends Engineer, structural: Not on file    Marital Status: Married  Intimate Partner  Violence: Unknown (04/27/2023)   Received from [facility]   HITS    Physically Hurt: Not on file    Insult or Talk Down To: Not on file    Threaten Physical Harm: Not on file    Scream or Curse: Not on file    Outpatient Medications Prior to Visit  Medication Sig Dispense Refill   Fexofenadine-Pseudoephedrine (ALLEGRA-D 12 HOUR PO) Take by mouth daily.     finasteride (PROPECIA) 1 MG tablet Take 1 mg by mouth daily.     metroNIDAZOLE (METROCREAM) 0.75 % cream Apply topically.     minoxidil (LONITEN) 2.5 MG tablet Take 2.5 mg by mouth daily.     tadalafil (CIALIS) 5 MG tablet Take 1 tablet by mouth daily.     tamsulosin (FLOMAX) 0.4 MG CAPS capsule Take 1 capsule (0.4 mg total) by mouth daily. 90 capsule 3   cetirizine (ZYRTEC) 10 MG tablet TAKE 1 TABLET BY MOUTH EVERY DAY (Patient not taking: Reported on 01/01/2024) 90 tablet 2   No facility-administered medications prior to visit.    Allergies  Allergen Reactions   Molds & Smuts     Review of Systems  Constitutional:  Negative for appetite change, chills, fatigue and fever.  HENT:  Negative for congestion, dental problem, ear pain and sore throat.   Eyes:  Negative for discharge, redness and visual disturbance.  Respiratory:  Negative for cough, chest tightness, shortness of breath and wheezing.   Cardiovascular:  Negative for chest pain, palpitations and leg swelling.  Gastrointestinal:  Negative for abdominal pain, blood in stool, diarrhea, nausea and vomiting.  Genitourinary:  Negative for difficulty urinating, dysuria, flank pain, frequency, hematuria and urgency.  Musculoskeletal:  Negative for arthralgias, back pain, joint swelling, myalgias and neck stiffness.  Skin:  Negative for pallor and rash.  Neurological:  Negative for dizziness, speech difficulty, weakness and headaches.  Hematological:  Negative for adenopathy. Does not bruise/bleed easily.  Psychiatric/Behavioral:  Negative for confusion and sleep  disturbance. The patient is not nervous/anxious.     PE;    01/01/2024    8:48 AM 03/31/2023    2:48 PM 12/26/2022    8:56 AM  Vitals with BMI  Height 5\' 11"  5\' 11"  6\' 0"   Weight 216 lbs 10 oz 214 lbs 8 oz 224 lbs 6 oz  BMI 30.22 29.93 30.43  Systolic 112 114 811  Diastolic 77 72 67  Pulse 74 75 73   Gen: Alert, well appearing.  Patient is oriented to person, place, time, and situation.  AFFECT: pleasant, lucid thought and speech. ENT: Ears: EACs clear, normal epithelium.  TMs with good light reflex and landmarks bilaterally.  Eyes: no injection, icteris, swelling, or exudate.  EOMI, PERRLA. Nose: no drainage or turbinate edema/swelling.  No injection or focal lesion.  Mouth: lips without lesion/swelling.  Oral mucosa pink and moist.  Dentition intact and without obvious caries or gingival swelling.  Oropharynx without erythema, exudate, or swelling.  Neck: supple/nontender.  No LAD, mass, or TM.  Carotid pulses 2+ bilaterally, without bruits. CV: RRR, no m/r/g.   LUNGS: CTA bilat, nonlabored resps, good aeration in all lung fields. ABD: soft, NT, ND, BS normal.  No hepatospenomegaly or mass.  No bruits. EXT: no clubbing, cyanosis, or edema.  Musculoskeletal: no joint swelling, erythema, warmth, or tenderness.  ROM of all joints intact. Skin - no sores or suspicious lesions or rashes or color changes  Pertinent labs:  Lab Results  Component Value Date   TSH 1.67 12/26/2022   Lab Results  Component Value Date   WBC 5.7 12/26/2022   HGB 15.7 12/26/2022   HCT 46.0 12/26/2022   MCV 88.6 12/26/2022   PLT 257.0 12/26/2022   Lab Results  Component Value Date   CREATININE 1.05 12/26/2022   BUN 12 12/26/2022   NA 139 12/26/2022   K 4.3 12/26/2022   CL 103 12/26/2022   CO2 27 12/26/2022   Lab Results  Component Value Date   ALT 30 12/26/2022   AST 19 12/26/2022   ALKPHOS 56 12/26/2022   BILITOT 0.8 12/26/2022   Lab Results  Component Value Date   CHOL 216 (H) 12/26/2022    Lab Results  Component Value Date   HDL 35.50 (L) 12/26/2022   Lab Results  Component Value Date   LDLCALC 78 06/17/2021   Lab Results  Component Value Date   TRIG 272.0 (H) 12/26/2022   Lab Results  Component Value Date   CHOLHDL 6 12/26/2022   Lab Results  Component Value Date   PSA 0.69 12/26/2022   PSA 0.68 12/13/2021   PSA 0.42 07/01/2016   Lab Results  Component Value Date   HGBA1C 5.4 12/13/2021   ASSESSMENT AND PLAN:   No problem-specific Assessment & Plan notes found for this encounter.  #1 health maintenance exam: Reviewed age and gender appropriate health maintenance issues (prudent diet, regular exercise, health risks of tobacco and excessive alcohol, use of seatbelts, fire alarms in home, use of sunscreen).  Also reviewed age and gender appropriate health screening as well as vaccine recommendations. Vaccines: Shingrix-> #1 today.  Prevnar 20 today.   Otherwise ALL UTD. Labs: fasting HP labs ordered. Prostate ca screening: PSA today Colon ca screening: FH CC father.  TCS 04/2018 benign polyp.  Recall 2024-2029 timeframe.  #2 microhematuria. UA today showed trace blood. This is consistent with past monitoring.  He is followed by urology and gets periodic MRI scans for surveillance of a known right renal mass that is indeterminate.  An After Visit Summary was printed and given to the patient.  FOLLOW UP:  Return in about 1 year (around 12/31/2024) for annual CPE (fasting).  Signed:  Santiago Bumpers, MD           01/01/2024

## 2024-01-03 ENCOUNTER — Encounter: Payer: Self-pay | Admitting: Family Medicine

## 2024-02-03 ENCOUNTER — Encounter: Payer: Self-pay | Admitting: Family Medicine

## 2024-02-25 ENCOUNTER — Encounter: Payer: Self-pay | Admitting: Family Medicine

## 2024-02-25 ENCOUNTER — Ambulatory Visit: Attending: Family Medicine

## 2024-02-25 ENCOUNTER — Ambulatory Visit (INDEPENDENT_AMBULATORY_CARE_PROVIDER_SITE_OTHER): Admitting: Family Medicine

## 2024-02-25 VITALS — BP 108/68 | HR 75 | Temp 98.7°F | Ht 71.0 in | Wt 209.6 lb

## 2024-02-25 DIAGNOSIS — I479 Paroxysmal tachycardia, unspecified: Secondary | ICD-10-CM

## 2024-02-25 DIAGNOSIS — R002 Palpitations: Secondary | ICD-10-CM

## 2024-02-25 DIAGNOSIS — I2089 Other forms of angina pectoris: Secondary | ICD-10-CM

## 2024-02-25 NOTE — Progress Notes (Signed)
 OFFICE VISIT  02/25/2024  CC:  Chief Complaint  Patient presents with   Elevated heart rate    Around 160, racing, feels exerted; denies fatigue, dizziness.     Patient is a 53 y.o. male who presents for racing heart.  HPI: About 2 months ago when he started working out in the yard he started having episodes of palpitations/heart racing associated with some breathing and fatigue that would make him feel like he had just sprinted.  He would stop what he was doing and rest and the symptoms would go away.  A couple of times he felt like he was going to pass out if he did not stop and rest. No associated chest pain, arm pain, jaw pain, nausea, or diaphoresis.  He has never gotten these episodes when he has been at rest and doing just normal day-to-day activities. It is always with moderate exertion.  In 1 instance he was wearing an Apple Watch and the heart rate came up as 160 and said no A-fib.  Past Medical History:  Diagnosis Date   Allergic rhinitis    [facility] in [place].  Dust mites and mold--to start immunotherapy as of 12/2019.   Anal fissure 2009   Androgenic alopecia    Minoxidil per dermatologist   Chronic pansinusitis    ENT Dr. Virgia Griffins   Family history of colon cancer in father    q 5 yr colonoscopy   GERD (gastroesophageal reflux disease)    History of prostatitis    Has seen urology.  Flomax  and cetirizine  helpful.   Hypertriglyceridemia 2022   improved with TLC   Microscopic hematuria    2023-->imaging showed small stone + small R renal mass-->urol doing surveillance imaging    Past Surgical History:  Procedure Laterality Date   COLONOSCOPY  04/2018   benign polyp, +FH father--rpt 5 yrs [facility])    Outpatient Medications Prior to Visit  Medication Sig Dispense Refill   Fexofenadine-Pseudoephedrine (ALLEGRA-D 12 HOUR PO) Take by mouth daily.     finasteride (PROPECIA) 1 MG tablet Take 1 mg by mouth daily.     metroNIDAZOLE (METROCREAM)  0.75 % cream Apply topically.     minoxidil (LONITEN) 2.5 MG tablet Take 2.5 mg by mouth daily.     tadalafil (CIALIS) 5 MG tablet Take 1 tablet by mouth daily.     tamsulosin  (FLOMAX ) 0.4 MG CAPS capsule Take 1 capsule (0.4 mg total) by mouth daily. 90 capsule 3   No facility-administered medications prior to visit.    Allergies  Allergen Reactions   Molds & Smuts     Review of Systems  As per HPI  PE:    02/25/2024    3:04 PM 01/01/2024    8:48 AM 03/31/2023    2:48 PM  Vitals with BMI  Height 5\' 11"  5\' 11"  5\' 11"   Weight 209 lbs 10 oz 216 lbs 10 oz 214 lbs 8 oz  BMI 29.25 30.22 29.93  Systolic 108 112 161  Diastolic 68 77 72  Pulse 75 74 75   Physical Exam  Gen: Alert, well appearing.  Patient is oriented to person, place, time, and situation. AFFECT: pleasant, lucid thought and speech. CV: RRR, no m/r/g.   LUNGS: CTA bilat, nonlabored resps, good aeration in all lung fields. ABD: soft, NT/ND EXT: no clubbing or cyanosis.  no edema.    LABS:  Last CBC Lab Results  Component Value Date   WBC 7.4 01/01/2024   HGB 15.9  01/01/2024   HCT 47.4 01/01/2024   MCV 91.3 01/01/2024   MCH 30.8 10/01/2015   RDW 13.1 01/01/2024   PLT 283.0 01/01/2024   Last metabolic panel Lab Results  Component Value Date   GLUCOSE 92 01/01/2024   NA 139 01/01/2024   K 4.5 01/01/2024   CL 103 01/01/2024   CO2 27 01/01/2024   BUN 14 01/01/2024   CREATININE 1.06 01/01/2024   GFR 81.06 01/01/2024   CALCIUM 9.7 01/01/2024   PROT 7.2 01/01/2024   ALBUMIN 4.9 01/01/2024   BILITOT 0.8 01/01/2024   ALKPHOS 46 01/01/2024   AST 15 01/01/2024   ALT 13 01/01/2024   ANIONGAP 6 10/01/2015   Last lipids Lab Results  Component Value Date   CHOL 164 01/01/2024   HDL 33.50 (L) 01/01/2024   LDLCALC 101 (H) 01/01/2024   LDLDIRECT 135.0 12/26/2022   TRIG 148.0 01/01/2024   CHOLHDL 5 01/01/2024   Last hemoglobin A1c Lab Results  Component Value Date   HGBA1C 5.4 12/13/2021   Last  thyroid  functions Lab Results  Component Value Date   TSH 1.69 01/01/2024   Twelve-lead EKG today : Sinus rhythm, rate 72.  No ST segment abnormalities.  No ectopy.  No hypertrophy.  Normal intervals and duration.  Compared to EKG 10/01/2015 there are no changes.  IMPRESSION AND PLAN:  Exertion-induced tachycardia. Primary dysrhythmia versus anginal equivalent. Check basic labs today--> CBC, c-Met, magnesium, TSH. Order myocardial perfusion imaging as well as Zio monitoring.  An After Visit Summary was printed and given to the patient.  FOLLOW UP: Return for f/u TBD based on results of cardiac testing.  Signed:  Arletha Lady, MD           02/25/2024

## 2024-02-25 NOTE — Progress Notes (Unsigned)
 EP to read.

## 2024-02-26 LAB — CBC WITH DIFFERENTIAL/PLATELET
Basophils Absolute: 0.1 10*3/uL (ref 0.0–0.1)
Basophils Relative: 0.9 % (ref 0.0–3.0)
Eosinophils Absolute: 0.2 10*3/uL (ref 0.0–0.7)
Eosinophils Relative: 2.6 % (ref 0.0–5.0)
HCT: 45.9 % (ref 39.0–52.0)
Hemoglobin: 15.5 g/dL (ref 13.0–17.0)
Lymphocytes Relative: 34.1 % (ref 12.0–46.0)
Lymphs Abs: 2.5 10*3/uL (ref 0.7–4.0)
MCHC: 33.7 g/dL (ref 30.0–36.0)
MCV: 91.1 fl (ref 78.0–100.0)
Monocytes Absolute: 0.5 10*3/uL (ref 0.1–1.0)
Monocytes Relative: 6.3 % (ref 3.0–12.0)
Neutro Abs: 4 10*3/uL (ref 1.4–7.7)
Neutrophils Relative %: 56.1 % (ref 43.0–77.0)
Platelets: 288 10*3/uL (ref 150.0–400.0)
RBC: 5.04 Mil/uL (ref 4.22–5.81)
RDW: 12.8 % (ref 11.5–15.5)
WBC: 7.2 10*3/uL (ref 4.0–10.5)

## 2024-02-26 LAB — COMPREHENSIVE METABOLIC PANEL WITH GFR
ALT: 12 U/L (ref 0–53)
AST: 13 U/L (ref 0–37)
Albumin: 4.6 g/dL (ref 3.5–5.2)
Alkaline Phosphatase: 47 U/L (ref 39–117)
BUN: 15 mg/dL (ref 6–23)
CO2: 28 meq/L (ref 19–32)
Calcium: 9.6 mg/dL (ref 8.4–10.5)
Chloride: 103 meq/L (ref 96–112)
Creatinine, Ser: 1.06 mg/dL (ref 0.40–1.50)
GFR: 80.98 mL/min (ref >60.00)
Glucose, Bld: 102 mg/dL — ABNORMAL HIGH (ref 70–99)
Potassium: 4.2 meq/L (ref 3.5–5.1)
Sodium: 139 meq/L (ref 135–145)
Total Bilirubin: 0.5 mg/dL (ref 0.2–1.2)
Total Protein: 6.7 g/dL (ref 6.0–8.3)

## 2024-02-26 LAB — TSH: TSH: 0.88 u[IU]/mL (ref 0.35–5.50)

## 2024-02-26 LAB — MAGNESIUM: Magnesium: 2.2 mg/dL (ref 1.5–2.5)

## 2024-03-08 ENCOUNTER — Encounter (HOSPITAL_COMMUNITY): Payer: Self-pay | Admitting: Family Medicine

## 2024-03-14 ENCOUNTER — Other Ambulatory Visit: Payer: Self-pay | Admitting: Family Medicine

## 2024-03-14 DIAGNOSIS — R002 Palpitations: Secondary | ICD-10-CM

## 2024-03-14 DIAGNOSIS — I479 Paroxysmal tachycardia, unspecified: Secondary | ICD-10-CM

## 2024-03-14 DIAGNOSIS — I2089 Other forms of angina pectoris: Secondary | ICD-10-CM

## 2024-03-15 ENCOUNTER — Ambulatory Visit: Payer: Self-pay | Admitting: Family Medicine

## 2024-03-15 ENCOUNTER — Encounter: Payer: Self-pay | Admitting: Family Medicine

## 2024-03-15 ENCOUNTER — Telehealth (HOSPITAL_COMMUNITY): Payer: Self-pay | Admitting: *Deleted

## 2024-03-15 NOTE — Telephone Encounter (Signed)
 Left detailed instructions for stress test.

## 2024-03-17 ENCOUNTER — Ambulatory Visit (HOSPITAL_COMMUNITY): Attending: Family Medicine

## 2024-03-17 DIAGNOSIS — I2089 Other forms of angina pectoris: Secondary | ICD-10-CM

## 2024-03-17 DIAGNOSIS — R002 Palpitations: Secondary | ICD-10-CM | POA: Diagnosis present

## 2024-03-17 DIAGNOSIS — I479 Paroxysmal tachycardia, unspecified: Secondary | ICD-10-CM | POA: Diagnosis present

## 2024-03-17 MED ORDER — TECHNETIUM TC 99M TETROFOSMIN IV KIT
30.8000 | PACK | Freq: Once | INTRAVENOUS | Status: AC | PRN
  Administered 2024-03-17: 30.8 via INTRAVENOUS

## 2024-03-17 MED ORDER — TECHNETIUM TC 99M TETROFOSMIN IV KIT
10.3000 | PACK | Freq: Once | INTRAVENOUS | Status: AC | PRN
  Administered 2024-03-17: 10.3 via INTRAVENOUS

## 2024-03-17 MED ORDER — REGADENOSON 0.4 MG/5ML IV SOLN: 0.4000 mg | Freq: Once | INTRAVENOUS | Status: AC

## 2024-03-18 ENCOUNTER — Encounter: Payer: Self-pay | Admitting: Family Medicine

## 2024-03-18 LAB — MYOCARDIAL PERFUSION IMAGING
Angina Index: 0
Estimated workload: 10.1
Exercise duration (min): 10 min
Exercise duration (sec): 0 s
LV dias vol: 107 mL (ref 62–150)
LV sys vol: 30 mL
MPHR: 168 {beats}/min
Nuc Stress EF: 72 %
Peak HR: 150 {beats}/min
Percent HR: 89 %
Rest HR: 72 {beats}/min
Rest Nuclear Isotope Dose: 10.3 mCi
SDS: 0
SRS: 5
SSS: 0
Stress Nuclear Isotope Dose: 30.8 mCi
TID: 1

## 2024-03-29 ENCOUNTER — Ambulatory Visit: Payer: Self-pay | Admitting: Family Medicine

## 2024-03-29 ENCOUNTER — Encounter: Payer: Self-pay | Admitting: Family Medicine

## 2024-10-07 ENCOUNTER — Encounter: Payer: Self-pay | Admitting: Family Medicine

## 2024-10-12 ENCOUNTER — Ambulatory Visit: Payer: Self-pay

## 2024-10-12 ENCOUNTER — Encounter: Payer: Self-pay | Admitting: Family Medicine

## 2024-10-12 ENCOUNTER — Ambulatory Visit (INDEPENDENT_AMBULATORY_CARE_PROVIDER_SITE_OTHER): Admitting: Sports Medicine

## 2024-10-12 VITALS — BP 118/82 | HR 70 | Temp 97.7°F | Wt 216.8 lb

## 2024-10-12 DIAGNOSIS — R053 Chronic cough: Secondary | ICD-10-CM | POA: Diagnosis not present

## 2024-10-12 DIAGNOSIS — J302 Other seasonal allergic rhinitis: Secondary | ICD-10-CM | POA: Diagnosis not present

## 2024-10-12 LAB — BASIC METABOLIC PANEL WITH GFR
BUN: 16 mg/dL (ref 6–23)
CO2: 31 meq/L (ref 19–32)
Calcium: 9.9 mg/dL (ref 8.4–10.5)
Chloride: 105 meq/L (ref 96–112)
Creatinine, Ser: 1.11 mg/dL (ref 0.40–1.50)
GFR: 76.28 mL/min (ref >60.00)
Glucose, Bld: 115 mg/dL — ABNORMAL HIGH (ref 70–99)
Potassium: 4.4 meq/L (ref 3.5–5.1)
Sodium: 142 meq/L (ref 135–145)

## 2024-10-12 LAB — CBC WITH DIFFERENTIAL/PLATELET
Basophils Absolute: 0.1 K/uL (ref 0.0–0.1)
Basophils Relative: 1.2 % (ref 0.0–3.0)
Eosinophils Absolute: 0.2 K/uL (ref 0.0–0.7)
Eosinophils Relative: 4 % (ref 0.0–5.0)
HCT: 44.1 % (ref 39.0–52.0)
Hemoglobin: 15.2 g/dL (ref 13.0–17.0)
Lymphocytes Relative: 30.4 % (ref 12.0–46.0)
Lymphs Abs: 1.8 K/uL (ref 0.7–4.0)
MCHC: 34.5 g/dL (ref 30.0–36.0)
MCV: 88 fl (ref 78.0–100.0)
Monocytes Absolute: 0.4 K/uL (ref 0.1–1.0)
Monocytes Relative: 6.6 % (ref 3.0–12.0)
Neutro Abs: 3.5 K/uL (ref 1.4–7.7)
Neutrophils Relative %: 57.8 % (ref 43.0–77.0)
Platelets: 232 K/uL (ref 150.0–400.0)
RBC: 5.01 Mil/uL (ref 4.22–5.81)
RDW: 12.9 % (ref 11.5–15.5)
WBC: 6 K/uL (ref 4.0–10.5)

## 2024-10-12 MED ORDER — MONTELUKAST SODIUM 10 MG PO TABS: 10.0000 mg | ORAL_TABLET | Freq: Every day | ORAL | 3 refills | Status: AC

## 2024-10-12 MED ORDER — BENZONATATE 200 MG PO CAPS: 200.0000 mg | ORAL_CAPSULE | Freq: Two times a day (BID) | ORAL | 0 refills | Status: AC | PRN

## 2024-10-12 NOTE — Telephone Encounter (Signed)
 See MyChart encounter

## 2024-10-12 NOTE — Progress Notes (Signed)
 Careteam: Patient Care Team: Candise Aleene DEL, MD as PCP - General (Family Medicine) Roark Rush, MD as Consulting Physician (Otolaryngology) Carolee Sherwood JONETTA DOUGLAS, MD as Consulting Physician (Urology) Janit Shores, MD as Consulting Physician (Urology) Lyell Geofm BRAVO, MD as Consulting Physician (Urology) Evern Tita Kingsley, MD as Consulting Physician (Urology)  Allergies  Allergen Reactions   Molds & Smuts     Chief Complaint  Patient presents with   Cough    Pt has had a cough now for 2 months. He was seen at urgent care in October and has not gotten better. He has chest congestion and the cough is worse at night.    Discussed the use of AI scribe software for clinical note transcription with the patient, who gave verbal consent to proceed.  History of Present Illness    Raymond Nichols is a 53 year old male who presents with chronic cough and chest congestion.  He has been experiencing chest congestion and a cough for the last couple of months. The cough is sometimes dry and sometimes productive with phlegm, particularly worsening at night. He wakes up feeling congested and needs to sit up to clear his throat. The cough is persistent when he talks, which is frequent due to his work.  The symptoms began after a trip to [place]  in September, where both he and his wife developed a cough. His wife's symptoms resolved, but his persisted. He visited a walk-in clinic on October 29th and was prescribed a five-day course of steroids, which temporarily alleviated his symptoms. However, the cough returned a few days after his surgery on November 12th.  He describes the phlegm as primarily building up on the right side of his chest, and he can sometimes clear it with deep coughing, especially after a shower. He had a similar episode last year, which resolved with prednisone  and albuterol. He denies fever, chills, or night sweats.  He has been using Allegra and a nasal spray  daily, as prescribed by a walk-in clinic last week, but these have not improved his symptoms and Allegra makes him feel worse. He has a history of sinus congestion and a slight deviated septum, but no significant findings were noted by an ENT.  He denies smoking, heartburn, or acid reflux symptoms currently, although he was previously prescribed heartburn medication without benefit. He has a known allergy to mold and pet dander, but no pets at home. No sore throat, ear pain, or drainage, and no significant change in his voice post-surgery, although he feels a slight hoarseness.  He has not been exercising much due to recent surgery but has been walking without experiencing shortness of breath or wheezing. He notes an uncomfortable feeling in his chest, particularly when sitting, and describes the cough as cyclical, with periods of worsening and improvement.  Review of Systems:  Review of Systems  Constitutional:  Negative for chills, fever and weight loss.  HENT:  Positive for congestion. Negative for ear pain, sinus pain and sore throat.   Eyes:  Negative for double vision.  Respiratory:  Positive for cough and sputum production. Negative for shortness of breath.   Cardiovascular:  Negative for chest pain, palpitations and leg swelling.  Gastrointestinal:  Negative for abdominal pain, heartburn and nausea.  Genitourinary:  Negative for dysuria, frequency and hematuria.  Musculoskeletal:  Negative for falls and myalgias.  Neurological:  Negative for dizziness, sensory change and focal weakness.   Negative unless indicated in HPI.   Patient Active Problem  List   Diagnosis Date Noted   Right kidney mass 08/31/2024   Deviated septum 10/10/2019   Hypertrophy, nasal, turbinate 10/10/2019   Perennial allergic rhinitis 10/10/2019   Chronic pansinusitis 05/04/2019   Dyslipidemia 11/15/2018   Hyperglycemia 11/15/2018   Chronic prostatitis 01/06/2018   Family history of colon cancer in father  11/12/2017   GERD (gastroesophageal reflux disease) 01/04/2016   Low HDL (under 40) 01/04/2016   Health maintenance examination 02/08/2014   Hair loss 08/13/2012   Past Medical History:  Diagnosis Date   Allergic rhinitis    [facility] in [place].  Dust mites and mold--to start immunotherapy as of 12/2019.   Allergy    Anal fissure 2009   Androgenic alopecia    Minoxidil per dermatologist   Chronic pansinusitis    ENT Dr. Roark   Family history of colon cancer in father    q 5 yr colonoscopy   GERD (gastroesophageal reflux disease)    History of prostatitis    Has seen urology.  Flomax  and cetirizine  helpful.   Hypertriglyceridemia 2022   improved with TLC   Microscopic hematuria    2023-->imaging showed small stone + small R renal mass-->urol doing surveillance imaging   Past Surgical History:  Procedure Laterality Date   CARDIOVASCULAR STRESS TEST     03/2024 lexiscan  low risk   CARDIOVASCULAR STRESS TEST     03/2024 low risk   COLONOSCOPY  04/2018   benign polyp, +FH father--rpt 5 yrs ([facility])   ZIO monitoring     03/2024, few brief nonsustained SVT, longest 17 beat.  No afib or other pathologic arrhythmia   Social History   Tobacco Use   Smoking status: Never   Smokeless tobacco: Never  Vaping Use   Vaping status: Never Used  Substance Use Topics   Alcohol use: Yes    Comment: 2 beers once a week   Drug use: No   Family History  Problem Relation Age of Onset   Cancer Mother 72       lung (smoker)   CAD Mother 42       stent (smoker)   Hypertension Mother    Diabetes Mother    Hyperlipidemia Father    Colon cancer Father 74   Hypertension Brother    Diabetes Maternal Grandmother    Alcohol abuse Maternal Grandfather    Cancer Maternal Grandfather    Cancer Paternal Grandfather        pancreatic   Stomach cancer Neg Hx    Allergies  Allergen Reactions   Molds & Smuts     Medications: Patient's Medications  New Prescriptions    No medications on file  Previous Medications   FEXOFENADINE-PSEUDOEPHEDRINE (ALLEGRA-D 12 HOUR PO)    Take by mouth daily.   FINASTERIDE (PROPECIA) 1 MG TABLET    Take 1 mg by mouth daily.   METRONIDAZOLE (METROCREAM) 0.75 % CREAM    Apply topically.   MINOXIDIL (LONITEN) 2.5 MG TABLET    Take 2.5 mg by mouth daily.   TADALAFIL (CIALIS) 5 MG TABLET    Take 1 tablet by mouth daily.   TAMSULOSIN  (FLOMAX ) 0.4 MG CAPS CAPSULE    Take 1 capsule (0.4 mg total) by mouth daily.  Modified Medications   No medications on file  Discontinued Medications   No medications on file    Physical Exam: Vitals:   10/12/24 1326  BP: 118/82  Pulse: 70  Temp: 97.7 F (36.5 C)  TempSrc: Oral  SpO2: 99%  Weight: 216 lb 12.8 oz (98.3 kg)   Body mass index is 30.24 kg/m. BP Readings from Last 3 Encounters:  10/12/24 118/82  02/25/24 108/68  01/01/24 112/77   Wt Readings from Last 3 Encounters:  10/12/24 216 lb 12.8 oz (98.3 kg)  02/25/24 209 lb 9.6 oz (95.1 kg)  01/01/24 216 lb 9.6 oz (98.2 kg)    Physical Exam Constitutional:      Appearance: Normal appearance.  HENT:     Head: Normocephalic and atraumatic.     Right Ear: Tympanic membrane normal.     Left Ear: Tympanic membrane normal.     Nose:     Comments: No sinus tenderness    Mouth/Throat:     Pharynx: Posterior oropharyngeal erythema present. No oropharyngeal exudate.  Cardiovascular:     Rate and Rhythm: Normal rate and regular rhythm.     Pulses: Normal pulses.     Heart sounds: Normal heart sounds.  Pulmonary:     Effort: No respiratory distress.     Breath sounds: No stridor. No wheezing or rales.  Abdominal:     General: Bowel sounds are normal. There is no distension.     Palpations: Abdomen is soft.     Tenderness: There is no abdominal tenderness. There is no guarding.  Musculoskeletal:        General: No swelling.  Lymphadenopathy:     Cervical: No cervical adenopathy.  Neurological:     Mental Status: He is  alert. Mental status is at baseline.     Sensory: No sensory deficit.     Motor: No weakness.     Labs reviewed: Basic Metabolic Panel: Recent Labs    01/01/24 0906 02/25/24 1542  NA 139 139  K 4.5 4.2  CL 103 103  CO2 27 28  GLUCOSE 92 102*  BUN 14 15  CREATININE 1.06 1.06  CALCIUM 9.7 9.6  MG  --  2.2  TSH 1.69 0.88   Liver Function Tests: Recent Labs    01/01/24 0906 02/25/24 1542  AST 15 13  ALT 13 12  ALKPHOS 46 47  BILITOT 0.8 0.5  PROT 7.2 6.7  ALBUMIN 4.9 4.6   No results for input(s): LIPASE, AMYLASE in the last 8760 hours. No results for input(s): AMMONIA in the last 8760 hours. CBC: Recent Labs    01/01/24 0906 02/25/24 1542  WBC 7.4 7.2  NEUTROABS 4.8 4.0  HGB 15.9 15.5  HCT 47.4 45.9  MCV 91.3 91.1  PLT 283.0 288.0   Lipid Panel: Recent Labs    01/01/24 0906  CHOL 164  HDL 33.50*  LDLCALC 101*  TRIG 148.0  CHOLHDL 5   TSH: Recent Labs    01/01/24 0906 02/25/24 1542  TSH 1.69 0.88   A1C: Lab Results  Component Value Date   HGBA1C 5.4 12/13/2021    Assessment & Plan Chronic cough Pt c/o cough since 2 months Intermittent phlegm  Denies fevers, chills night sweats  Tried otc medications but no improvement Will get cbc, ct chest  Orders:   CBC w/Diff   Basic Metabolic Panel (BMET)   CT Chest W Contrast; Future   benzonatate  (TESSALON ) 200 MG capsule; Take 1 capsule (200 mg total) by mouth 2 (two) times daily as needed for cough.  Seasonal allergies Instructed to use claritin, flonase , consistently  Will start singulair  Orders:   montelukast  (SINGULAIR ) 10 MG tablet; Take 1 tablet (10 mg total) by mouth at bedtime.

## 2024-10-12 NOTE — Telephone Encounter (Signed)
 FYI Only or Action Required?: FYI only for provider: appointment scheduled on 12/10.  Patient was last seen in primary care on 02/25/2024 by McGowen, Aleene DEL, MD.  Called Nurse Triage reporting Cough.  Symptoms began several weeks ago.  Interventions attempted: OTC medications: Advil, Rx. Allegra.  Symptoms are: unchanged.  Triage Disposition: See Physician Within 24 Hours  Patient/caregiver understands and will follow disposition?: Yes           Copied from CRM #[id number]. Topic: Clinical - Red Word Triage >> Oct 12, 2024  8:25 AM Raymond Nichols wrote: Reason for RMF:[id number] wife called stating the patient has cough and chest congestion and it is not getting better. The patient can't sleep at night and he feel like someething is stuck in his lung. Patient went to [facility] three days ago and was given allergy medication. They also did a chest xray Reason for Disposition  [1] Continuous (nonstop) coughing interferes with work or school AND [2] no improvement using cough treatment per Care Advice  Answer Assessment - Initial Assessment Questions 1. ONSET: When did the cough begin?      Ongoing intermittently since October of this year.   2. SEVERITY: How bad is the cough today?      Deep, intermittent cough, worse at night   3. SPUTUM: Describe the color of your sputum (e.g., none, dry cough; clear, white, yellow, green)     Clear   4. HEMOPTYSIS: Are you coughing up any blood? If Yes, ask: How much? (e.g., flecks, streaks, tablespoons, etc.)     No   5. DIFFICULTY BREATHING: Are you having difficulty breathing? If Yes, ask: How bad is it? (e.g., mild, moderate, severe)      No   6. FEVER: Do you have a fever? If Yes, ask: What is your temperature, how was it measured, and when did it start?     No   7. CARDIAC HISTORY: Do you have any history of heart disease? (e.g., heart attack, congestive heart failure)      No   8. LUNG HISTORY: Do you  have any history of lung disease?  (e.g., pulmonary embolus, asthma, emphysema)  No   10. OTHER SYMPTOMS: Do you have any other symptoms? (e.g., runny nose, wheezing, chest pain)  No   Wife called in stating the patient is having Cough intermittently since October. She is not currently with the patient as he is at a dentist appt. Currently. She states the cough keeps him up at night. He was seen in UC 3 days ago and was prescribed Allegra for his allergies. Chest Xray was clear at that time. For home care he is taking OTC Advil. Appointment scheduled for evaluation. Wife agrees with plan of care, and will call back if anything changes, or if symptoms worsen.  Protocols used: Cough - Acute Productive-A-AH

## 2024-10-12 NOTE — Telephone Encounter (Signed)
LVM for pt to return call to assist with scheduling.

## 2024-10-13 ENCOUNTER — Ambulatory Visit: Payer: Self-pay | Admitting: Sports Medicine

## 2024-10-13 ENCOUNTER — Other Ambulatory Visit: Payer: Self-pay

## 2024-10-13 ENCOUNTER — Ambulatory Visit (INDEPENDENT_AMBULATORY_CARE_PROVIDER_SITE_OTHER)

## 2024-10-13 DIAGNOSIS — R739 Hyperglycemia, unspecified: Secondary | ICD-10-CM

## 2024-10-13 DIAGNOSIS — R918 Other nonspecific abnormal finding of lung field: Secondary | ICD-10-CM

## 2024-10-13 DIAGNOSIS — C649 Malignant neoplasm of unspecified kidney, except renal pelvis: Secondary | ICD-10-CM

## 2024-10-13 LAB — HEMOGLOBIN A1C: Hgb A1c MFr Bld: 5.3 % (ref 4.6–6.5)

## 2024-10-18 ENCOUNTER — Encounter: Payer: Self-pay | Admitting: Sports Medicine

## 2024-10-19 ENCOUNTER — Ambulatory Visit

## 2024-10-19 DIAGNOSIS — R053 Chronic cough: Secondary | ICD-10-CM | POA: Diagnosis not present

## 2024-10-19 MED ORDER — IOHEXOL 300 MG/ML  SOLN
100.0000 mL | Freq: Once | INTRAMUSCULAR | Status: AC | PRN
  Administered 2024-10-19: 13:00:00 75 mL via INTRAVENOUS

## 2024-10-26 NOTE — Telephone Encounter (Signed)
 IMPRESSION: 1. No acute findings in the chest. Specifically, no findings to explain the patient's history of chronic cough. 2. Bilateral pulmonary nodules measuring up to 10 mm. Given the patient's history of renal cell carcinoma, close follow-up warranted as metastatic disease or primary bronchogenic neoplasm not excluded. PET-CT may prove helpful to further evaluate.  Will order PET

## 2024-10-28 ENCOUNTER — Encounter: Payer: Self-pay | Admitting: Sports Medicine

## 2024-10-28 DIAGNOSIS — R911 Solitary pulmonary nodule: Secondary | ICD-10-CM

## 2024-11-29 ENCOUNTER — Encounter: Payer: Self-pay | Admitting: Sports Medicine

## 2024-11-29 MED ORDER — PANTOPRAZOLE SODIUM 40 MG PO TBEC: 40.0000 mg | DELAYED_RELEASE_TABLET | Freq: Every day | ORAL | 3 refills | Status: AC

## 2024-11-29 NOTE — Telephone Encounter (Signed)
 Pt was seen by Dr.V on 12/10. Routing to PCP to further advise

## 2024-11-29 NOTE — Telephone Encounter (Signed)
 Pantoprazole rx sent.

## 2025-01-02 ENCOUNTER — Encounter: Payer: 59 | Admitting: Family Medicine
